# Patient Record
Sex: Female | Born: 2000
Health system: Southern US, Community
[De-identification: ages and names within clinical notes are randomized; demographics above are authoritative.]

## PROBLEM LIST (undated history)

## (undated) DIAGNOSIS — A749 Chlamydial infection, unspecified: Secondary | ICD-10-CM

## (undated) DIAGNOSIS — T7840XA Allergy, unspecified, initial encounter: Secondary | ICD-10-CM

## (undated) HISTORY — DX: Allergy, unspecified, initial encounter: T78.40XA

---
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
## 2019-06-18 ENCOUNTER — Ambulatory Visit: Payer: Managed Care, Other (non HMO)

## 2019-07-23 ENCOUNTER — Ambulatory Visit: Payer: Managed Care, Other (non HMO) | Attending: Internal Medicine

## 2019-07-23 DIAGNOSIS — Z23 Encounter for immunization: Secondary | ICD-10-CM

## 2019-07-23 NOTE — Progress Notes (Signed)
   Covid-19 Vaccination Clinic  Name:  Rachel West    MRN: [id number] DOB: 04/03/2000  07/23/2019  Ms. Rachel West was observed post Covid-19 immunization for 15 minutes without incident. She was provided with Vaccine Information Sheet and instruction to access the V-Safe system.   Ms. Rachel West was instructed to call 911 with any severe reactions post vaccine: Marland Kitchen Difficulty breathing  . Swelling of face and throat  . A fast heartbeat  . A bad rash all over body  . Dizziness and weakness   Immunizations Administered    Name Date Dose VIS Date Route   Pfizer COVID-19 Vaccine 07/23/2019 11:01 AM 0.3 mL 05/04/2018 Intramuscular   Manufacturer: [employer], [employer]   Lot: [id number]   NDC: [address]-1

## 2019-08-15 ENCOUNTER — Ambulatory Visit: Payer: Managed Care, Other (non HMO) | Attending: Internal Medicine

## 2019-08-15 DIAGNOSIS — Z23 Encounter for immunization: Secondary | ICD-10-CM

## 2019-08-15 NOTE — Progress Notes (Signed)
   Covid-19 Vaccination Clinic  Name:  Rachel West    MRN: [id number] DOB: 22-Mar-2000  08/15/2019  [name] was observed post Covid-19 immunization for 15 minutes without incident. She was provided with Vaccine Information Sheet and instruction to access the V-Safe system.   [name] was instructed to call 911 with any severe reactions post vaccine: Marland Kitchen Difficulty breathing  . Swelling of face and throat  . A fast heartbeat  . A bad rash all over body  . Dizziness and weakness   Immunizations Administered    Name Date Dose VIS Date Route   Pfizer COVID-19 Vaccine 08/15/2019  9:53 AM 0.3 mL 05/04/2018 Intramuscular   Manufacturer: [employer], [employer]   Lot: [id number]   NDC: [address]-1

## 2019-11-30 ENCOUNTER — Ambulatory Visit: Payer: Managed Care, Other (non HMO) | Attending: Adult Medicine | Admitting: Physical Therapy

## 2019-11-30 ENCOUNTER — Other Ambulatory Visit: Payer: Self-pay

## 2019-11-30 ENCOUNTER — Encounter: Payer: Self-pay | Admitting: Physical Therapy

## 2019-11-30 DIAGNOSIS — G8929 Other chronic pain: Secondary | ICD-10-CM | POA: Insufficient documentation

## 2019-11-30 DIAGNOSIS — M545 Low back pain, unspecified: Secondary | ICD-10-CM

## 2019-11-30 DIAGNOSIS — M62838 Other muscle spasm: Secondary | ICD-10-CM | POA: Insufficient documentation
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
## 2019-11-30 NOTE — Patient Instructions (Signed)
Access Code: [email or web address]: 09/22/2021Prepared by: Victorino Dike PaaExercises  Supine Lower Trunk Rotation - 2 x daily - 7 x weekly - 2 sets - 10 reps - 10 hold  Supine Transversus Abdominis Bracing - Hands on Stomach - 2 x daily - 7 x weekly - 2 sets - 10 reps - 5 hold  Cat-Camel - 2 x daily - 7 x weekly - 1 sets - 10 reps - 10 hold  Cat-Camel to Child's Pose - 2 x daily - 7 x weekly - 1 sets - 5 reps - 30 hold  Child's Pose with Thread the Needle - 2 x daily - 7 x weekly - 1 sets - 5 reps - 30 hold

## 2019-12-07 ENCOUNTER — Ambulatory Visit: Payer: Managed Care, Other (non HMO) | Admitting: Physical Therapy

## 2019-12-07 ENCOUNTER — Other Ambulatory Visit: Payer: Self-pay

## 2019-12-07 ENCOUNTER — Encounter: Payer: Self-pay | Admitting: Physical Therapy

## 2019-12-07 DIAGNOSIS — G8929 Other chronic pain: Secondary | ICD-10-CM

## 2019-12-07 DIAGNOSIS — M545 Low back pain: Secondary | ICD-10-CM | POA: Diagnosis not present

## 2019-12-07 DIAGNOSIS — M62838 Other muscle spasm: Secondary | ICD-10-CM

## 2019-12-07 NOTE — Therapy (Signed)
[facility] Outpatient Rehabilitation [facility] [address] [place], [place], [address] Phone: [phone]   Fax:  [phone]  Physical Therapy Treatment  Patient Details  Name: Rachel West MRN: [id number] Date of Birth: 11-Apr-2000 Referring Provider (PT): Dr. Francisca December   Encounter Date: 12/07/2019   PT End of Session - 12/07/19 1024    Visit Number 2    Number of Visits 12    Date for PT Re-Evaluation 01/11/20    Authorization Type [employer]    PT Start Time 1017    PT Stop Time 1055    PT Time Calculation (min) 38 min           Past Medical History:  Diagnosis Date  . Allergy     History reviewed. No pertinent surgical history.  There were no vitals filed for this visit.   Subjective Assessment - 12/07/19 1019    Subjective No pain right now. Increased with sitting for 20 minutes especially with slouching.    Currently in Pain? No/denies                             [facility] Adult PT Treatment/Exercise - 12/07/19 0001      Exercises   Exercises Lumbar      Lumbar Exercises: Stretches   Press Ups 10 reps;10 seconds    Press Ups Limitations for abstretch and lumbar extension ROM     Quadruped Mid Back Stretch 1 rep;5 reps    Quadruped Mid Back Stretch Limitations 60 sec , then laterals     Other Lumbar Stretch Exercise open book with top LE crossed over bottom     Other Lumbar Stretch Exercise Qped slow rocking into childs pose       Lumbar Exercises: Aerobic   Nustep L5 UE/LE x 5 minues    reported right lumbar pain     Lumbar Exercises: Supine   Bent Knee Raise Limitations Table top holds, 10 sec  at 90/90 with unilateral lift/lower     Dead Bug 10 reps    Dead Bug Limitations from 90/90    Bridge 20 reps      Lumbar Exercises: Quadruped   Madcat/Old Horse 10 reps    Opposite Arm/Leg Raise 10 reps                  PT Education - 12/07/19 1030    Education Details FOTO report and predictions reviewed with  patient    Person(s) Educated Patient    Methods Explanation;Handout    Comprehension Verbalized understanding               PT Long Term Goals - 11/30/19 1237      PT LONG TERM GOAL #1   Title Pt will be able to demo normal spinal ROM, flexion and extension without pain    Time 6    Period Weeks    Status New    Target Date 01/11/20      PT LONG TERM GOAL #2   Title Pt will be able to sit more comfortably in class with min pain increase after 45 min    Time 6    Period Weeks    Status New    Target Date 01/11/20      PT LONG TERM GOAL #3   Title Pt will be able to report no pain with transfers in and out the car    Time 6  Period Weeks    Status New    Target Date 01/11/20      PT LONG TERM GOAL #4   Title Pt will be able to demo safe core routine without increasing back pain    Time 6    Period Weeks    Status New    Target Date 01/11/20      PT LONG TERM GOAL #5   Title Pt will be able to improve FOTO to 31% limited or less    Baseline 48%    Time 6    Period Weeks    Status New    Target Date 01/11/20                 Plan - 12/07/19 1032    Clinical Impression Statement pt reports min relief with initial stretches. Reviewed HEP and progressed with core/ Lumbar stabilization. Updated HEP.    PT Next Visit Plan spinal mobility (q-ped) and stabilzation. FOTO- 6th visits status    PT Home Exercise Plan Access Code: [email or web address]: LTR, pelvic tilt, q-ped cat cow to childs pose to thread the needle           Patient will benefit from skilled therapeutic intervention in order to improve the following deficits and impairments:  Decreased mobility, Increased fascial restricitons, Impaired flexibility, Decreased range of motion, Pain, Difficulty walking, Decreased strength  Visit Diagnosis: Chronic bilateral low back pain without sciatica  Other muscle spasm     Problem List Patient Active Problem List   Diagnosis Date Noted  . Allergic  rhinitis 08/20/2014  . RAD (reactive airway disease) 03/22/2012    Sherrie Mustache, PTA 12/07/2019, 10:59 AM  [facility] [address] [place], [place], [address] Phone: [phone]   Fax:  [phone]  Name: Rachel West MRN: [id number] Date of Birth: [birth year]

## 2019-12-13 ENCOUNTER — Ambulatory Visit: Payer: Managed Care, Other (non HMO) | Attending: Adult Medicine | Admitting: Physical Therapy

## 2019-12-13 ENCOUNTER — Other Ambulatory Visit: Payer: Self-pay

## 2019-12-13 ENCOUNTER — Encounter: Payer: Self-pay | Admitting: Physical Therapy

## 2019-12-13 DIAGNOSIS — M545 Low back pain, unspecified: Secondary | ICD-10-CM | POA: Insufficient documentation

## 2019-12-13 DIAGNOSIS — M62838 Other muscle spasm: Secondary | ICD-10-CM | POA: Diagnosis present

## 2019-12-13 DIAGNOSIS — G8929 Other chronic pain: Secondary | ICD-10-CM | POA: Insufficient documentation

## 2019-12-13 NOTE — Therapy (Signed)
[facility] Outpatient Rehabilitation [facility] [address] [place], [place], [address] Phone: [phone]   Fax:  [phone]  Physical Therapy Treatment  Patient Details  Name: Rachel West MRN: [id number] Date of Birth: March 04, 2001 Referring Provider (PT): Dr. Francisca December   Encounter Date: 12/13/2019   PT End of Session - 12/13/19 1107    Visit Number 3    Number of Visits 12    Date for PT Re-Evaluation 01/11/20    Authorization Type [employer]    PT Start Time 1104    PT Stop Time 1145    PT Time Calculation (min) 41 min           Past Medical History:  Diagnosis Date  . Allergy     History reviewed. No pertinent surgical history.  There were no vitals filed for this visit.   Subjective Assessment - 12/13/19 1109    Subjective no pain on arrival. Pain increased with prolonged walking or sitting. Had pan with walking in the neighborhood.    Currently in Pain? No/denies                             [facility] Adult PT Treatment/Exercise - 12/13/19 0001      Lumbar Exercises: Stretches   Press Ups 10 reps;10 seconds    Press Ups Limitations for abstretch and lumbar extension ROM     Quadruped Mid Back Stretch 1 rep;5 reps    Quadruped Mid Back Stretch Limitations 60 sec , then laterals     Other Lumbar Stretch Exercise open book with top LE crossed over bottom     Other Lumbar Stretch Exercise Qped slow rocking into childs pose       Lumbar Exercises: Aerobic   Nustep L5 UE/LE x 5 minues    reported right lumbar pain     Lumbar Exercises: Supine   Bent Knee Raise Limitations Table top holds, 10 sec  at 90/90 with unilateral lift/lower     Dead Bug 10 reps    Dead Bug Limitations from 90/90    Bridge 20 reps    Other Supine Lumbar Exercises clam with green band x 20 cues for abdominal draw in       Lumbar Exercises: Quadruped   Madcat/Old Horse 10 reps    Opposite Arm/Leg Raise 10 reps      Cryotherapy   Number Minutes  Cryotherapy 10 Minutes    Cryotherapy Location Lumbar Spine    Type of Cryotherapy Ice pack      Manual Therapy   Manual Therapy Soft tissue mobilization    Soft tissue mobilization STW right lumbar paraspinal and QL                        PT Long Term Goals - 11/30/19 1237      PT LONG TERM GOAL #1   Title Pt will be able to demo normal spinal ROM, flexion and extension without pain    Time 6    Period Weeks    Status New    Target Date 01/11/20      PT LONG TERM GOAL #2   Title Pt will be able to sit more comfortably in class with min pain increase after 45 min    Time 6    Period Weeks    Status New    Target Date 01/11/20      PT LONG TERM  GOAL #3   Title Pt will be able to report no pain with transfers in and out the car    Time 6    Period Weeks    Status New    Target Date 01/11/20      PT LONG TERM GOAL #4   Title Pt will be able to demo safe core routine without increasing back pain    Time 6    Period Weeks    Status New    Target Date 01/11/20      PT LONG TERM GOAL #5   Title Pt will be able to improve FOTO to 31% limited or less    Baseline 48%    Time 6    Period Weeks    Status New    Target Date 01/11/20                 Plan - 12/13/19 1107    Clinical Impression Statement Pt reports she notes slow improvement. She has difficulty with sitting. She has slightly less pain with transfers and is still most painful on right lower lumbar. Reviewed and continued core progression. Performed STW to right lumbar and applied ice pack to decreased pain.    PT Next Visit Plan spinal mobility (q-ped) and stabilzation. FOTO- 6th visits status    PT Home Exercise Plan Access Code: [email or web address]: LTR, pelvic tilt, q-ped cat cow to childs pose to thread the needle           Patient will benefit from skilled therapeutic intervention in order to improve the following deficits and impairments:  Decreased mobility, Increased fascial restricitons,  Impaired flexibility, Decreased range of motion, Pain, Difficulty walking, Decreased strength  Visit Diagnosis: Chronic bilateral low back pain without sciatica  Other muscle spasm     Problem List Patient Active Problem List   Diagnosis Date Noted  . Allergic rhinitis 08/20/2014  . RAD (reactive airway disease) 03/22/2012    Sherrie Mustache, PTA 12/13/2019, 12:55 PM  [facility] [address] [place], [place], [address] Phone: [phone]   Fax:  [phone]  Name: Rachel West MRN: [id number] Date of Birth: Jan 19, 2001

## 2019-12-21 ENCOUNTER — Encounter: Payer: Self-pay | Admitting: Physical Therapy

## 2019-12-21 ENCOUNTER — Other Ambulatory Visit: Payer: Self-pay

## 2019-12-21 ENCOUNTER — Ambulatory Visit: Payer: Managed Care, Other (non HMO) | Admitting: Physical Therapy

## 2019-12-21 DIAGNOSIS — M62838 Other muscle spasm: Secondary | ICD-10-CM

## 2019-12-21 DIAGNOSIS — M545 Low back pain, unspecified: Secondary | ICD-10-CM | POA: Diagnosis not present

## 2019-12-21 DIAGNOSIS — G8929 Other chronic pain: Secondary | ICD-10-CM

## 2019-12-21 NOTE — Therapy (Signed)
[facility] Outpatient Rehabilitation [facility] [address] [place], [place], [address] Phone: [phone]   Fax:  [phone]  Physical Therapy Treatment  Patient Details  Name: Rachel West MRN: [id number] Date of Birth: 07-Jul-2000 Referring Provider (PT): Dr. Francisca December   Encounter Date: 12/21/2019   PT End of Session - 12/21/19 1104    Visit Number 4    Number of Visits 12    Date for PT Re-Evaluation 01/11/20    Authorization Type [employer]    PT Start Time 1100    PT Stop Time 1153    PT Time Calculation (min) 53 min           Past Medical History:  Diagnosis Date  . Allergy     History reviewed. No pertinent surgical history.  There were no vitals filed for this visit.   Subjective Assessment - 12/21/19 1104    Subjective No pain on arrival. Walking longer without pain.    Currently in Pain? No/denies                             [facility] Adult PT Treatment/Exercise - 12/21/19 0001      Lumbar Exercises: Stretches   Press Ups 10 reps;10 seconds    Press Ups Limitations for abstretch and lumbar extension ROM     Quadruped Mid Back Stretch 1 rep;5 reps    Quadruped Mid Back Stretch Limitations 60 sec , then laterals     Other Lumbar Stretch Exercise open book with top LE crossed over bottom       Lumbar Exercises: Aerobic   Recumbent Bike L2 x 5 minutes       Lumbar Exercises: Standing   Row 20 reps    Theraband Level (Row) Level 3 (Green)    Shoulder Extension 20 reps    Theraband Level (Shoulder Extension) Level 3 (Green)    Other Standing Lumbar Exercises functional squats using free motion for UE x 20      Lumbar Exercises: Supine   Bent Knee Raise Limitations Table top holds, 10 sec  at 90/90 with unilateral lift/lower     Dead Bug 10 reps    Dead Bug Limitations from 90/90    Other Supine Lumbar Exercises clam with green band x 20 cues for abdominal draw in       Lumbar Exercises: Sidelying   Other Sidelying  Lumbar Exercises plank on elbow and knees- 10 hip taps       Lumbar Exercises: Prone   Opposite Arm/Leg Raise 10 reps    Opposite Arm/Leg Raise Limitations each , cues for core bracing    Other Prone Lumbar Exercises POE plank x 20 sec       Lumbar Exercises: Quadruped   Madcat/Old Horse 10 reps    Opposite Arm/Leg Raise 10 reps    Opposite Arm/Leg Raise Limitations tapping elbow to knee x 10 each side       Cryotherapy   Cryotherapy Location Lumbar Spine      Manual Therapy   Soft tissue mobilization STW right lumbar paraspinal and QL                        PT Long Term Goals - 11/30/19 1237      PT LONG TERM GOAL #1   Title Pt will be able to demo normal spinal ROM, flexion and extension without pain    Time  6    Period Weeks    Status New    Target Date 01/11/20      PT LONG TERM GOAL #2   Title Pt will be able to sit more comfortably in class with min pain increase after 45 min    Time 6    Period Weeks    Status New    Target Date 01/11/20      PT LONG TERM GOAL #3   Title Pt will be able to report no pain with transfers in and out the car    Time 6    Period Weeks    Status New    Target Date 01/11/20      PT LONG TERM GOAL #4   Title Pt will be able to demo safe core routine without increasing back pain    Time 6    Period Weeks    Status New    Target Date 01/11/20      PT LONG TERM GOAL #5   Title Pt will be able to improve FOTO to 31% limited or less    Baseline 48%    Time 6    Period Weeks    Status New    Target Date 01/11/20                 Plan - 12/21/19 1105    Clinical Impression Statement Pt reports continued improvement in pain and improved activity tolerance. She is able to walk outdoors up to 2 hours with her friends in her neighborhood without increased pain. She has pain with sitting 20 minutes or more but if she stretches in her chair she does not have increased pain. She reports transfers are better if she has  been moving otherwise transfers can still be painful after prolonged positioning. Continued with lumbar stab in various positions. No pain with any therex today. Continued STW to right lower lumbar.    PT Next Visit Plan spinal mobility (q-ped) and stabilzation. FOTO- 6th visits status    PT Home Exercise Plan Access Code: [email or web address]: LTR, pelvic tilt, q-ped cat cow to childs pose to thread the needle           Patient will benefit from skilled therapeutic intervention in order to improve the following deficits and impairments:  Decreased mobility, Increased fascial restricitons, Impaired flexibility, Decreased range of motion, Pain, Difficulty walking, Decreased strength  Visit Diagnosis: Chronic bilateral low back pain without sciatica  Other muscle spasm     Problem List Patient Active Problem List   Diagnosis Date Noted  . Allergic rhinitis 08/20/2014  . RAD (reactive airway disease) 03/22/2012    Sherrie Mustache, PTA 12/21/2019, 11:46 AM  [facility] [address] [place], [place], [address] Phone: [phone]   Fax:  [phone]  Name: Teandra Harlan MRN: [id number] Date of Birth: 02/18/2001

## 2019-12-23 ENCOUNTER — Other Ambulatory Visit: Payer: Self-pay

## 2019-12-23 ENCOUNTER — Encounter: Payer: Self-pay | Admitting: Physical Therapy

## 2019-12-23 ENCOUNTER — Ambulatory Visit: Payer: Managed Care, Other (non HMO) | Admitting: Physical Therapy

## 2019-12-23 DIAGNOSIS — M62838 Other muscle spasm: Secondary | ICD-10-CM

## 2019-12-23 DIAGNOSIS — M545 Low back pain, unspecified: Secondary | ICD-10-CM | POA: Diagnosis not present

## 2019-12-23 DIAGNOSIS — G8929 Other chronic pain: Secondary | ICD-10-CM

## 2019-12-23 NOTE — Therapy (Addendum)
[place], [place], [address] Phone: [phone]   Fax:  [phone]  Physical Therapy Treatment/Discharge  Patient Details  Name: Rachel West MRN: [id number] Date of Birth: 07/19/2000 Referring Provider (PT): Dr. Vira Browns   Encounter Date: 12/23/2019   PT End of Session - 12/23/19 1158    Visit Number 5    Number of Visits 12    Date for PT Re-Evaluation 01/11/20    Authorization Type [employer]    Authorization Time Period Needs 6th visit FOTO status    PT Start Time 1102    PT Stop Time 1200    PT Time Calculation (min) 58 min           Past Medical History:  Diagnosis Date  . Allergy     History reviewed. No pertinent surgical history.  There were no vitals filed for this visit.   Subjective Assessment - 12/23/19 1106    Subjective No pain right now. Felt really stretched out after last session.    Currently in Pain? No/denies                      [facility] Adult PT Treatment/Exercise - 12/23/19 0001      Lumbar Exercises: Stretches   Press Ups 10 reps;10 seconds    Quadruped Mid Back Stretch 1 rep;5 reps    Quadruped Mid Back Stretch Limitations 60 sec , then laterals       Lumbar Exercises: Aerobic   Recumbent Bike L2 x 5 minutes       Lumbar Exercises: Standing   Row 20 reps    Theraband Level (Row) Level 3 (Green)    Shoulder Extension 20 reps    Theraband Level (Shoulder Extension) Level 3 (Green)    Other Standing Lumbar Exercises Palloff press double green band x 10 each way     Other Standing Lumbar Exercises functional squats at sink       Lumbar Exercises: Supine   AB Set Limitations Pilates ab prep x 10    head lift/ UE reach   Bent Knee Raise Limitations at 90/90 with unilateral lift/lower     Dead Bug 10 reps    Dead Bug Limitations from 90/90    Bridge 20 reps    Bridge with clamshell 10 reps      Lumbar Exercises: Sidelying   Other Sidelying  Lumbar Exercises plank on elbow and knees 10 sec x 2 each       Lumbar Exercises: Prone   Opposite Arm/Leg Raise 10 reps    Opposite Arm/Leg Raise Limitations each , cues for core bracing    Other Prone Lumbar Exercises POE plank x 25 sec x 2      Lumbar Exercises: Quadruped   Madcat/Old Horse 10 reps    Opposite Arm/Leg Raise 20 reps    Opposite Arm/Leg Raise Limitations tapping elbow to knee x 10 each side       Modalities   Modalities Moist Heat      Moist Heat Therapy   Number Minutes Moist Heat 10 Minutes    Moist Heat Location Lumbar Spine                       PT Long Term Goals - 11/30/19 1237      PT LONG TERM GOAL #1   Title Pt will be able to demo normal spinal ROM, flexion and extension without pain  Time 6    Period Weeks    Status New    Target Date 01/11/20      PT LONG TERM GOAL #2   Title Pt will be able to sit more comfortably in class with min pain increase after 45 min    Time 6    Period Weeks    Status New    Target Date 01/11/20      PT LONG TERM GOAL #3   Title Pt will be able to report no pain with transfers in and out the car    Time 6    Period Weeks    Status New    Target Date 01/11/20      PT LONG TERM GOAL #4   Title Pt will be able to demo safe core routine without increasing back pain    Time 6    Period Weeks    Status New    Target Date 01/11/20      PT LONG TERM GOAL #5   Title Pt will be able to improve FOTO to 31% limited or less    Baseline 48%    Time 6    Period Weeks    Status New    Target Date 01/11/20                 Plan - 12/23/19 1155    Clinical Impression Statement Pt reports she felt stretched out after last session and feels STW is helpful. She continues with right low back pain and tightness in right paraspinal with prolonged positioning. She is interested in [facility].    PT Next Visit Plan TPDN next session if possible, spinal mobility (q-ped) and stabilzation. FOTO- 6th visits  status    PT Home Exercise Plan Access Code: [email or web address]: LTR, pelvic tilt, q-ped cat cow to childs pose to thread the needle           Patient will benefit from skilled therapeutic intervention in order to improve the following deficits and impairments:  Decreased mobility, Increased fascial restricitons, Impaired flexibility, Decreased range of motion, Pain, Difficulty walking, Decreased strength  Visit Diagnosis: Chronic bilateral low back pain without sciatica  Other muscle spasm     Problem List Patient Active Problem List   Diagnosis Date Noted  . Allergic rhinitis 08/20/2014  . RAD (reactive airway disease) 03/22/2012    Dorene Ar, PTA 12/23/2019, 12:02 PM  [facility] [address] [place], [place], [address] Phone: [phone]   Fax:  [phone]  Name: Rachel West MRN: [id number] Date of Birth: 12/08/2000  PHYSICAL THERAPY DISCHARGE SUMMARY  Visits from Start of Care: 5  Current functional level related to goals / functional outcomes: See above   Remaining deficits: Unknown   Education / Equipment: HEP, posture, body mechanics Plan: Patient agrees to discharge.  Patient goals were not met. Patient is being discharged due to financial reasons.  ?????     Raeford Razor, PT 02/24/20 11:45 AM Phone: [phone] Fax: [phone]

## 2019-12-26 ENCOUNTER — Ambulatory Visit: Payer: Managed Care, Other (non HMO) | Admitting: Physical Therapy

## 2019-12-28 ENCOUNTER — Ambulatory Visit: Payer: Managed Care, Other (non HMO) | Admitting: Physical Therapy

## 2020-04-12 ENCOUNTER — Other Ambulatory Visit: Payer: Self-pay

## 2020-04-12 ENCOUNTER — Ambulatory Visit (HOSPITAL_COMMUNITY)
Admission: RE | Admit: 2020-04-12 | Discharge: 2020-04-12 | Disposition: A | Discharge status: Home / Self Care | Payer: Managed Care, Other (non HMO) | Source: Ambulatory Visit | Attending: Emergency Medicine | Admitting: Emergency Medicine

## 2020-04-12 VITALS — BP 104/61 | HR 92 | Temp 98.0°F | Resp 19

## 2020-04-12 DIAGNOSIS — M545 Low back pain, unspecified: Secondary | ICD-10-CM

## 2020-04-12 DIAGNOSIS — Z791 Long term (current) use of non-steroidal anti-inflammatories (NSAID): Secondary | ICD-10-CM | POA: Diagnosis not present

## 2020-04-12 DIAGNOSIS — G8929 Other chronic pain: Secondary | ICD-10-CM

## 2020-04-12 DIAGNOSIS — Z113 Encounter for screening for infections with a predominantly sexual mode of transmission: Secondary | ICD-10-CM | POA: Diagnosis not present

## 2020-04-12 DIAGNOSIS — Z793 Long term (current) use of hormonal contraceptives: Secondary | ICD-10-CM | POA: Diagnosis not present

## 2020-04-12 LAB — HIV ANTIBODY (ROUTINE TESTING W REFLEX): HIV Screen 4th Generation wRfx: NONREACTIVE

## 2020-04-12 MED ORDER — PREDNISONE 10 MG PO TABS: 40.0000 mg | ORAL_TABLET | Freq: Every day | ORAL | 0 refills | Status: DC
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
## 2020-04-12 NOTE — Discharge Instructions (Addendum)
Follow up with PCP if symptoms worsen for reevaluation   Take 4 pills by mouth every morning for 5 days. Do not take pills at night   Results for lab will come back in 2-3 days. Will call if positive

## 2020-04-13 ENCOUNTER — Telehealth (HOSPITAL_COMMUNITY): Payer: Self-pay | Admitting: Emergency Medicine

## 2020-04-13 LAB — CERVICOVAGINAL ANCILLARY ONLY
Bacterial Vaginitis (gardnerella): NEGATIVE
Candida Glabrata: NEGATIVE
Candida Vaginitis: POSITIVE — AB
Chlamydia: NEGATIVE
Comment: NEGATIVE
Comment: NEGATIVE
Comment: NEGATIVE
Comment: NEGATIVE
Comment: NEGATIVE
Comment: NORMAL
Neisseria Gonorrhea: NEGATIVE
Trichomonas: NEGATIVE

## 2020-04-13 LAB — RPR: RPR Ser Ql: NONREACTIVE

## 2020-04-13 MED ORDER — FLUCONAZOLE 150 MG PO TABS: 150.0000 mg | ORAL_TABLET | Freq: Once | ORAL | 0 refills | Status: AC

## 2020-07-14 ENCOUNTER — Other Ambulatory Visit: Payer: Self-pay

## 2020-07-14 ENCOUNTER — Ambulatory Visit
Admission: RE | Admit: 2020-07-14 | Discharge: 2020-07-14 | Disposition: A | Discharge status: Home / Self Care | Payer: Managed Care, Other (non HMO) | Source: Ambulatory Visit | Attending: Family Medicine | Admitting: Family Medicine

## 2020-07-14 VITALS — BP 116/79 | HR 93 | Temp 98.2°F | Resp 20

## 2020-07-14 DIAGNOSIS — N898 Other specified noninflammatory disorders of vagina: Secondary | ICD-10-CM | POA: Diagnosis present

## 2020-07-14 HISTORY — DX: Chlamydial infection, unspecified: A74.9

## 2020-07-14 MED ORDER — METRONIDAZOLE 500 MG PO TABS: 500.0000 mg | ORAL_TABLET | Freq: Two times a day (BID) | ORAL | 0 refills | Status: DC

## 2020-07-14 NOTE — ED Provider Notes (Signed)
[facility]   [id number] 07/14/20 Arrival Time: 0908  ASSESSMENT & PLAN:  1. Vaginal discharge    No indication for gonorrhea/chlamydia empiric tx at this time. Tx empirically for BV.  Begin: Meds ordered this encounter  Medications  . metroNIDAZOLE (FLAGYL) 500 MG tablet    Sig: Take 1 tablet (500 mg total) by mouth 2 (two) times daily.    Dispense:  14 tablet    Refill:  0     Discharge Instructions     We have sent testing for sexually transmitted infections. We will notify you of any positive results once they are received. If required, we will prescribe any medications you might need.  Please refrain from all sexual activity for at least the next seven days.     Without s/s of PID.  Labs Reviewed  CERVICOVAGINAL ANCILLARY ONLY   Will notify of any positive results. Instructed to refrain from sexual activity for at least seven days.  Reviewed expectations re: course of current medical issues. Questions answered. Outlined signs and symptoms indicating need for more acute intervention. Patient verbalized understanding. After Visit Summary given.   SUBJECTIVE:  Rachel West is a 20 y.o. female who presents with complaint of vaginal discharge. H/O BV 'and it feels like it'. Onset gradual. First noticed 2-3 d ago. Describes discharge as thin and clear; without significant odor. No specific aggravating or alleviating factors reported. Denies: urinary frequency, dysuria and gross hematuria. Afebrile. No abdominal or pelvic pain. Normal PO intake wihout n/v. No genital rashes or lesions. Reports that she is sexually active but without much concern re: STD. OTC treatment: none.  Patient's last menstrual period was 06/18/2020 (exact date).   OBJECTIVE:  Vitals:   07/14/20 0921  BP: 116/79  Pulse: 93  Resp: 20  Temp: 98.2 F (36.8 C)  TempSrc: Oral  SpO2: 99%     General appearance: alert, cooperative, appears stated age and no distress Lungs:  unlabored respirations; speaks full sentences without difficulty Back: no CVA tenderness; FROM at waist Abdomen: soft, non-tender GU: deferred Skin: warm and dry Psychological: alert and cooperative; normal mood and affect.   Labs Reviewed  CERVICOVAGINAL ANCILLARY ONLY    No Known Allergies  Past Medical History:  Diagnosis Date  . Allergy   . Chlamydia    Family History  Problem Relation Age of Onset  . Diabetes Mother   . Hyperlipidemia Father    Social History   Socioeconomic History  . Marital status: Single    Spouse name: Not on file  . Number of children: Not on file  . Years of education: Not on file  . Highest education level: Not on file  Occupational History  . Not on file  Tobacco Use  . Smoking status: Never Smoker  . Smokeless tobacco: Never Used  Vaping Use  . Vaping Use: Never used  Substance and Sexual Activity  . Alcohol use: Not Currently  . Drug use: Never  . Sexual activity: Yes    Birth control/protection: Pill  Other Topics Concern  . Not on file  Social History Narrative  . Not on file   Social Determinants of Health   Financial Resource Strain: Not on file  Food Insecurity: Not on file  Transportation Needs: Not on file  Physical Activity: Not on file  Stress: Not on file  Social Connections: Not on file  Intimate Partner Violence: Not on file          Mardella Layman, MD 07/14/20  1110  

## 2020-07-14 NOTE — ED Triage Notes (Signed)
Pt c/o vaginal odor x 2-3 days; states vaginal discharge is normal for her.  Denies abd pain.

## 2020-07-14 NOTE — Discharge Instructions (Signed)
We have sent testing for sexually transmitted infections. We will notify you of any positive results once they are received. If required, we will prescribe any medications you might need.  Please refrain from all sexual activity for at least the next seven days.  

## 2020-07-16 LAB — CERVICOVAGINAL ANCILLARY ONLY
Bacterial Vaginitis (gardnerella): POSITIVE — AB
Candida Glabrata: NEGATIVE
Candida Vaginitis: NEGATIVE
Chlamydia: NEGATIVE
Comment: NEGATIVE
Comment: NEGATIVE
Comment: NEGATIVE
Comment: NEGATIVE
Comment: NEGATIVE
Comment: NORMAL
Neisseria Gonorrhea: NEGATIVE
Trichomonas: NEGATIVE

## 2020-07-29 ENCOUNTER — Ambulatory Visit
Admission: RE | Admit: 2020-07-29 | Discharge: 2020-07-29 | Disposition: A | Discharge status: Home / Self Care | Payer: Managed Care, Other (non HMO) | Source: Ambulatory Visit | Attending: Nurse Practitioner | Admitting: Nurse Practitioner

## 2020-07-29 ENCOUNTER — Other Ambulatory Visit: Payer: Self-pay

## 2020-07-29 VITALS — BP 108/71 | HR 93 | Temp 98.2°F

## 2020-07-29 DIAGNOSIS — N898 Other specified noninflammatory disorders of vagina: Secondary | ICD-10-CM | POA: Diagnosis not present

## 2020-07-29 DIAGNOSIS — R8281 Pyuria: Secondary | ICD-10-CM | POA: Insufficient documentation

## 2020-07-29 DIAGNOSIS — Z113 Encounter for screening for infections with a predominantly sexual mode of transmission: Secondary | ICD-10-CM | POA: Insufficient documentation

## 2020-07-29 LAB — POCT URINALYSIS DIP (MANUAL ENTRY)
Bilirubin, UA: NEGATIVE
Glucose, UA: NEGATIVE mg/dL
Ketones, POC UA: NEGATIVE mg/dL
Nitrite, UA: NEGATIVE
Protein Ur, POC: NEGATIVE mg/dL
Spec Grav, UA: >=1.03 — AB (ref 1.010–1.025)
Urobilinogen, UA: 0.2 E.U./dL
pH, UA: 7 (ref 5.0–8.0)

## 2020-07-29 LAB — POCT URINE PREGNANCY: Preg Test, Ur: NEGATIVE

## 2020-07-29 MED ORDER — METRONIDAZOLE 500 MG PO TABS: 500.0000 mg | ORAL_TABLET | Freq: Two times a day (BID) | ORAL | 0 refills | Status: DC

## 2020-07-29 MED ORDER — NITROFURANTOIN MONOHYD MACRO 100 MG PO CAPS: 100.0000 mg | ORAL_CAPSULE | Freq: Two times a day (BID) | ORAL | 0 refills | Status: DC

## 2020-07-29 MED ORDER — FLUCONAZOLE 150 MG PO TABS: 150.0000 mg | ORAL_TABLET | ORAL | 0 refills | Status: AC

## 2020-07-29 NOTE — ED Provider Notes (Signed)
[facility]    CSN: [id number] Arrival date & time: 07/29/20  0904      History   Chief Complaint Chief Complaint  Patient presents with  . Appointment    Vaginal Discharge    HPI Rachel West is a 20 y.o. female.   Subjective:   Rachel West is a 20 y.o. female who presents for evaluation of vaginal symptoms of itching, irritation and vaginal discharge.  The discharge is described as greenish/yellowish and somewhat thick in consistency.  Symptoms have been present for the past 4-5 days. She denies any burning, lesions, odor, pain, dysuria, urinary frequency, nausea, vomiting, back pain, flank pain or fevers.  Menstrual pattern: bleeding regularly, LMP 07/20/20. Contraception: birth control pills. STI Risk: Possible STD exposure as patient is sexually active and does not use barrier methods.  The following portions of the patient's history were reviewed and updated as appropriate: allergies, current medications, past family history, past medical history, past social history, past surgical history and problem list.        Past Medical History:  Diagnosis Date  . Allergy   . Chlamydia     Patient Active Problem List   Diagnosis Date Noted  . Allergic rhinitis 08/20/2014  . RAD (reactive airway disease) 03/22/2012    History reviewed. No pertinent surgical history.  OB History   No obstetric history on file.      Home Medications    Prior to Admission medications   Medication Sig Start Date End Date Taking? Authorizing Provider  fluconazole (DIFLUCAN) 150 MG tablet Take 1 tablet (150 mg total) by mouth every 3 (three) days for 2 doses. 07/29/20 08/02/20 Yes Lurline Idol, FNP  metroNIDAZOLE (FLAGYL) 500 MG tablet Take 1 tablet (500 mg total) by mouth 2 (two) times daily. 07/29/20  Yes Lurline Idol, FNP  nitrofurantoin, macrocrystal-monohydrate, (MACROBID) 100 MG capsule Take 1 capsule (100 mg total) by mouth 2 (two) times daily. 07/29/20  Yes  Lurline Idol, FNP  norgestimate-ethinyl estradiol (ORTHO-CYCLEN) 0.25-35 MG-MCG tablet Take 1 tablet by mouth daily.   Yes [provider]  albuterol (PROVENTIL HFA;VENTOLIN HFA) 108 (90 BASE) MCG/ACT inhaler Inhale 2 puffs into the lungs every 6 (six) hours as needed for wheezing. Patient not taking: Reported on 05/02/2018 08/20/14 11/29/18  Elvina Sidle, MD  fluticasone [facility]) 50 MCG/ACT nasal spray Place 2 sprays into both nostrils daily. Patient not taking: Reported on 05/02/2018 08/20/14 11/29/18  Elvina Sidle, MD    Family History Family History  Problem Relation Age of Onset  . Diabetes Mother   . Hyperlipidemia Father     Social History Social History   Tobacco Use  . Smoking status: Never Smoker  . Smokeless tobacco: Never Used  Vaping Use  . Vaping Use: Never used  Substance Use Topics  . Alcohol use: Not Currently  . Drug use: Never     Allergies   Patient has no known allergies.   Review of Systems Review of Systems  Constitutional: Negative for fever.  Gastrointestinal: Negative for nausea and vomiting.  Genitourinary: Positive for vaginal discharge. Negative for dysuria, flank pain, frequency and genital sores.  All other systems reviewed and are negative.    Physical Exam Triage Vital Signs ED Triage Vitals [07/29/20 0918]  Enc Vitals Group     BP 108/71     Pulse Rate 93     Resp      Temp 98.2 F (36.8 C)     Temp Source Oral  SpO2 97 %     Weight      Height      Head Circumference      Peak Flow      Pain Score      Pain Loc      Pain Edu?      Excl. in GC?    No data found.  Updated Vital Signs BP 108/71 (BP Location: Left Arm)   Pulse 93   Temp 98.2 F (36.8 C) (Oral)   LMP 07/20/2020   SpO2 97%   Visual Acuity Right Eye Distance:   Left Eye Distance:   Bilateral Distance:    Right Eye Near:   Left Eye Near:    Bilateral Near:     Physical Exam Vitals reviewed.  Constitutional:       Appearance: Normal appearance.  HENT:     Head: Normocephalic.  Cardiovascular:     Rate and Rhythm: Normal rate.  Pulmonary:     Effort: Pulmonary effort is normal.  Abdominal:     General: Bowel sounds are normal.     Palpations: Abdomen is soft.     Tenderness: There is no abdominal tenderness. There is no right CVA tenderness or left CVA tenderness.  Musculoskeletal:        General: Normal range of motion.     Cervical back: Normal range of motion and neck supple.  Skin:    General: Skin is warm and dry.  Neurological:     General: No focal deficit present.     Mental Status: She is alert and oriented to person, place, and time.  Psychiatric:        Mood and Affect: Mood normal.        Behavior: Behavior normal.      UC Treatments / Results  Labs (all labs ordered are listed, but only abnormal results are displayed) Labs Reviewed  POCT URINALYSIS DIP (MANUAL ENTRY) - Abnormal; Notable for the following components:      Result Value   Spec Grav, UA >=1.030 (*)    Blood, UA trace-intact (*)    Leukocytes, UA Large (3+) (*)    All other components within normal limits  URINE CULTURE  POCT URINE PREGNANCY  CERVICOVAGINAL ANCILLARY ONLY    EKG   Radiology No results found.  Procedures Procedures (including critical care time)  Medications Ordered in UC Medications - No data to display  Initial Impression / Assessment and Plan / UC Course  I have reviewed the triage vital signs and the nursing notes.  Pertinent labs & imaging results that were available during my care of the patient were reviewed by me and considered in my medical decision making (see chart for details).    20 year old female presenting with vaginal itching, irritation and vaginal discharge.  Urinalysis shows large leukocytes even though she denies any dysuria.  She is sexually active and does not use barrier protection.  Plan: Prescription for Flagyl, Diflucan and Macrobid provided. Urine  cultures and vaginal cytology pending Symptomatic local care discussed. Abstinence from intercourse discussed. Discussed safe sex.  Today's evaluation has revealed no signs of a dangerous process. Discussed diagnosis with patient and/or guardian. Patient and/or guardian aware of their diagnosis, possible red flag symptoms to watch out for and need for close follow up. Patient and/or guardian understands verbal and written discharge instructions. Patient and/or guardian comfortable with plan and disposition.  Patient and/or guardian has a clear mental status at this time, good insight into  illness (after discussion and teaching) and has clear judgment to make decisions regarding their care  This care was provided during an unprecedented National Emergency due to the Novel Coronavirus (COVID-19) pandemic. COVID-19 infections and transmission risks place heavy strains on healthcare resources.  As this pandemic evolves, our facility, providers, and staff strive to respond fluidly, to remain operational, and to provide care relative to available resources and information. Outcomes are unpredictable and treatments are without well-defined guidelines. Further, the impact of COVID-19 on all aspects of urgent care, including the impact to patients seeking care for reasons other than COVID-19, is unavoidable during this national emergency. At this time of the global pandemic, management of patients has significantly changed, even for non-COVID positive patients given high local and regional COVID volumes at this time requiring high healthcare system and resource utilization. The standard of care for management of both COVID suspected and non-COVID suspected patients continues to change rapidly at the local, regional, national, and global levels. This patient was worked up and treated to the best available but ever changing evidence and resources available at this current time.   Documentation was completed with the  aid of voice recognition software. Transcription may contain typographical errors.   Final Clinical Impressions(s) / UC Diagnoses   Final diagnoses:  Vaginal discharge  Pyuria     Discharge Instructions     . Take all medications as prescribed. Make sure you finish all the medications even if you get better. Marland Kitchen Keep your genital area clean and dry. Use mild, unscented products . Do not douche or use tampons until your symptoms resolve.  . Do not have sex until your symptoms have resolved and you receive results from your test.  . Drink plenty of fluids     ED Prescriptions    Medication Sig Dispense Auth. Provider   metroNIDAZOLE (FLAGYL) 500 MG tablet Take 1 tablet (500 mg total) by mouth 2 (two) times daily. 14 tablet Lurline Idol, FNP   fluconazole (DIFLUCAN) 150 MG tablet Take 1 tablet (150 mg total) by mouth every 3 (three) days for 2 doses. 2 tablet Lurline Idol, FNP   nitrofurantoin, macrocrystal-monohydrate, (MACROBID) 100 MG capsule Take 1 capsule (100 mg total) by mouth 2 (two) times daily. 10 capsule Lurline Idol, FNP     PDMP not reviewed this encounter.   Cathlean Sauer [place], [place] 07/29/20 [phone]

## 2020-07-29 NOTE — ED Triage Notes (Signed)
Patient c/o vaginal discharge that's yellow x 3 days.  No odor but itching.

## 2020-07-29 NOTE — ED Notes (Signed)
Pt provided PO fluids to obtain additional urine sample for urine culture.

## 2020-07-29 NOTE — Discharge Instructions (Addendum)
Take all medications as prescribed. Make sure you finish all the medications even if you get better. Keep your genital area clean and dry. Use mild, unscented products Do not douche or use tampons until your symptoms resolve.  Do not have sex until your symptoms have resolved and you receive results from your test.  Drink plenty of fluids

## 2020-07-30 LAB — CERVICOVAGINAL ANCILLARY ONLY
Bacterial Vaginitis (gardnerella): NEGATIVE
Candida Glabrata: NEGATIVE
Candida Vaginitis: NEGATIVE
Chlamydia: NEGATIVE
Comment: NEGATIVE
Comment: NEGATIVE
Comment: NEGATIVE
Comment: NEGATIVE
Comment: NEGATIVE
Comment: NORMAL
Neisseria Gonorrhea: NEGATIVE
Trichomonas: NEGATIVE

## 2020-07-30 LAB — URINE CULTURE: Culture: 20000 — AB

## 2020-12-20 ENCOUNTER — Other Ambulatory Visit: Payer: Self-pay

## 2020-12-20 ENCOUNTER — Ambulatory Visit
Admission: RE | Admit: 2020-12-20 | Discharge: 2020-12-20 | Disposition: A | Discharge status: Home / Self Care | Payer: BC Managed Care – PPO | Source: Ambulatory Visit | Attending: Emergency Medicine | Admitting: Emergency Medicine

## 2020-12-20 VITALS — BP 122/83 | HR 103 | Temp 97.9°F | Resp 18

## 2020-12-20 DIAGNOSIS — N898 Other specified noninflammatory disorders of vagina: Secondary | ICD-10-CM | POA: Diagnosis present

## 2020-12-20 DIAGNOSIS — Z113 Encounter for screening for infections with a predominantly sexual mode of transmission: Secondary | ICD-10-CM | POA: Diagnosis present

## 2020-12-20 NOTE — Discharge Instructions (Addendum)
Your vaginal tests are pending.  If your test results are positive, we will call you.  You may require treatment at that time.  Do not have sexual activity for at least 7 days.    Follow up with your primary care provider if your symptoms are not improving.

## 2020-12-20 NOTE — ED Provider Notes (Signed)
Renaldo Fiddler    CSN: [id number] Arrival date & time: 12/20/20  1835      History   Chief Complaint Chief Complaint  Patient presents with   [facility] TRANSMITTED DISEASE    Appointment 1845     HPI Kaianna Dolezal is a 20 y.o. female.  Patient presents with malodorous white vaginal discharge.  She denies fever, chills, abdominal pain, pelvic pain, dysuria, hematuria, rash, lesions, or other symptoms.  She reports no sexual activity for the past 2 months.  History of bacterial vaginitis and chlamydia.  The history is provided by the patient and medical records.   Past Medical History:  Diagnosis Date   Allergy    Chlamydia     Patient Active Problem List   Diagnosis Date Noted   Allergic rhinitis 08/20/2014   RAD (reactive airway disease) 03/22/2012    History reviewed. No pertinent surgical history.  OB History   No obstetric history on file.      Home Medications    Prior to Admission medications   Medication Sig Start Date End Date Taking? Authorizing Provider  metroNIDAZOLE (FLAGYL) 500 MG tablet Take 1 tablet (500 mg total) by mouth 2 (two) times daily. 07/29/20   Lurline Idol, FNP  nitrofurantoin, macrocrystal-monohydrate, (MACROBID) 100 MG capsule Take 1 capsule (100 mg total) by mouth 2 (two) times daily. 07/29/20   Lurline Idol, FNP  norgestimate-ethinyl estradiol (ORTHO-CYCLEN) 0.25-35 MG-MCG tablet Take 1 tablet by mouth daily.    [provider]  albuterol (PROVENTIL HFA;VENTOLIN HFA) 108 (90 BASE) MCG/ACT inhaler Inhale 2 puffs into the lungs every 6 (six) hours as needed for wheezing. Patient not taking: Reported on 05/02/2018 08/20/14 11/29/18  Elvina Sidle, MD  fluticasone [facility]) 50 MCG/ACT nasal spray Place 2 sprays into both nostrils daily. Patient not taking: Reported on 05/02/2018 08/20/14 11/29/18  Elvina Sidle, MD    Family History Family History  Problem Relation Age of Onset   Diabetes Mother     Hyperlipidemia Father     Social History Social History   Tobacco Use   Smoking status: Never   Smokeless tobacco: Never  Vaping Use   Vaping Use: Never used  Substance Use Topics   Alcohol use: Not Currently   Drug use: Never     Allergies   Patient has no known allergies.   Review of Systems Review of Systems  Constitutional:  Negative for chills and fever.  Respiratory:  Negative for cough and shortness of breath.   Cardiovascular:  Negative for chest pain and palpitations.  Gastrointestinal:  Negative for abdominal pain and vomiting.  Genitourinary:  Positive for vaginal discharge. Negative for dysuria, flank pain, hematuria and pelvic pain.  Skin:  Negative for color change and rash.  All other systems reviewed and are negative.   Physical Exam Triage Vital Signs ED Triage Vitals  Enc Vitals Group     BP      Pulse      Resp      Temp      Temp src      SpO2      Weight      Height      Head Circumference      Peak Flow      Pain Score      Pain Loc      Pain Edu?      Excl. in GC?    No data found.  Updated Vital Signs BP 122/83 (BP Location: Left Arm)  Pulse (S) (!) 103   Temp 97.9 F (36.6 C) (Oral)   Resp 18   LMP 12/18/2020   SpO2 97%   Visual Acuity Right Eye Distance:   Left Eye Distance:   Bilateral Distance:    Right Eye Near:   Left Eye Near:    Bilateral Near:     Physical Exam Vitals and nursing note reviewed.  Constitutional:      General: She is not in acute distress.    Appearance: She is well-developed. She is not ill-appearing.  HENT:     Head: Normocephalic and atraumatic.     Mouth/Throat:     Mouth: Mucous membranes are moist.  Eyes:     Conjunctiva/sclera: Conjunctivae normal.  Cardiovascular:     Rate and Rhythm: Normal rate and regular rhythm.     Heart sounds: Normal heart sounds.  Pulmonary:     Effort: Pulmonary effort is normal. No respiratory distress.     Breath sounds: Normal breath sounds.   Abdominal:     Palpations: Abdomen is soft.     Tenderness: There is no abdominal tenderness. There is no right CVA tenderness, left CVA tenderness, guarding or rebound.  Musculoskeletal:     Cervical back: Neck supple.  Skin:    General: Skin is warm and dry.  Neurological:     General: No focal deficit present.     Mental Status: She is alert and oriented to person, place, and time.     Gait: Gait normal.  Psychiatric:        Mood and Affect: Mood normal.        Behavior: Behavior normal.     UC Treatments / Results  Labs (all labs ordered are listed, but only abnormal results are displayed) Labs Reviewed  CERVICOVAGINAL ANCILLARY ONLY    EKG   Radiology No results found.  Procedures Procedures (including critical care time)  Medications Ordered in UC Medications - No data to display  Initial Impression / Assessment and Plan / UC Course  I have reviewed the triage vital signs and the nursing notes.  Pertinent labs & imaging results that were available during my care of the patient were reviewed by me and considered in my medical decision making (see chart for details).  Vaginal discharge, screening for STDs.  Patient reports no sexual activity for the past 2 months.  Patient obtained vaginal self swab for testing.  Discussed that we will call her if the test results are positive requiring treatment.  Instructed her to abstain from sexual activity for at least 7 days.  Instructed her to follow-up with her PCP or OB/GYN if her symptoms are not improving.  Patient agrees to plan of care.    Final Clinical Impressions(s) / UC Diagnoses   Final diagnoses:  Vaginal discharge  Screening for STD (sexually transmitted disease)     Discharge Instructions      Your vaginal tests are pending.  If your test results are positive, we will call you.  You may require treatment at that time.  Do not have sexual activity for at least 7 days.    Follow up with your primary  care provider if your symptoms are not improving.         ED Prescriptions   None    PDMP not reviewed this encounter.   Mickie Bail, NP 12/20/20 1921

## 2020-12-20 NOTE — ED Triage Notes (Signed)
Patient presents to Urgent Care with complaints of possible STD testing. Pt complains of vaginal discharge with odor. Pt has a hx of BV. She reports frequent episodes before cycle and after cycle. She reports unprotected sex 2 months ago.   Denies abdominal pain or fever.

## 2020-12-21 LAB — CERVICOVAGINAL ANCILLARY ONLY
Bacterial Vaginitis (gardnerella): POSITIVE — AB
Candida Glabrata: NEGATIVE
Candida Vaginitis: POSITIVE — AB
Chlamydia: NEGATIVE
Comment: NEGATIVE
Comment: NEGATIVE
Comment: NEGATIVE
Comment: NEGATIVE
Comment: NEGATIVE
Comment: NORMAL
Neisseria Gonorrhea: NEGATIVE
Trichomonas: NEGATIVE

## 2020-12-24 ENCOUNTER — Telehealth (HOSPITAL_COMMUNITY): Payer: Self-pay | Admitting: Emergency Medicine

## 2020-12-24 MED ORDER — FLUCONAZOLE 150 MG PO TABS: 150.0000 mg | ORAL_TABLET | Freq: Once | ORAL | 0 refills | Status: AC

## 2020-12-24 MED ORDER — METRONIDAZOLE 500 MG PO TABS: 500.0000 mg | ORAL_TABLET | Freq: Two times a day (BID) | ORAL | 0 refills | Status: DC

## 2021-05-03 ENCOUNTER — Ambulatory Visit
Admission: RE | Admit: 2021-05-03 | Discharge: 2021-05-03 | Disposition: A | Discharge status: Home / Self Care | Payer: BC Managed Care – PPO | Source: Ambulatory Visit | Attending: Internal Medicine | Admitting: Internal Medicine

## 2021-05-03 ENCOUNTER — Other Ambulatory Visit: Payer: Self-pay

## 2021-05-03 VITALS — BP 129/74 | HR 96 | Temp 97.9°F | Resp 18

## 2021-05-03 DIAGNOSIS — Z113 Encounter for screening for infections with a predominantly sexual mode of transmission: Secondary | ICD-10-CM | POA: Diagnosis present

## 2021-05-03 DIAGNOSIS — B3731 Acute candidiasis of vulva and vagina: Secondary | ICD-10-CM | POA: Diagnosis not present

## 2021-05-03 DIAGNOSIS — A5602 Chlamydial vulvovaginitis: Secondary | ICD-10-CM | POA: Diagnosis not present

## 2021-05-03 NOTE — Discharge Instructions (Signed)
Your vaginal swab and blood work is pending.  We will call if it is positive and treat as appropriate.  Please refrain from sexual activity until test results and treatment are complete.

## 2021-05-03 NOTE — ED Provider Notes (Signed)
[facility]    CSN: [id number] Arrival date & time: 05/03/21  1356      History   Chief Complaint Chief Complaint  Patient presents with   2p appt   std testing    HPI Rachel West is a 21 y.o. female.   Patient presents today for routine STD testing.  She denies any symptoms or any recent exposure.  She is requesting blood work for HIV and syphilis as well.    Past Medical History:  Diagnosis Date   Allergy    Chlamydia     Patient Active Problem List   Diagnosis Date Noted   Allergic rhinitis 08/20/2014   RAD (reactive airway disease) 03/22/2012    History reviewed. No pertinent surgical history.  OB History   No obstetric history on file.      Home Medications    Prior to Admission medications   Medication Sig Start Date End Date Taking? Authorizing Provider  metroNIDAZOLE (FLAGYL) 500 MG tablet Take 1 tablet (500 mg total) by mouth 2 (two) times daily. 12/24/20   Lamptey, Britta Mccreedy, MD  nitrofurantoin, macrocrystal-monohydrate, (MACROBID) 100 MG capsule Take 1 capsule (100 mg total) by mouth 2 (two) times daily. 07/29/20   Lurline Idol, FNP  norgestimate-ethinyl estradiol (ORTHO-CYCLEN) 0.25-35 MG-MCG tablet Take 1 tablet by mouth daily.    [provider]  albuterol (PROVENTIL HFA;VENTOLIN HFA) 108 (90 BASE) MCG/ACT inhaler Inhale 2 puffs into the lungs every 6 (six) hours as needed for wheezing. Patient not taking: Reported on 05/02/2018 08/20/14 11/29/18  Elvina Sidle, MD  fluticasone [facility]) 50 MCG/ACT nasal spray Place 2 sprays into both nostrils daily. Patient not taking: Reported on 05/02/2018 08/20/14 11/29/18  Elvina Sidle, MD    Family History Family History  Problem Relation Age of Onset   Diabetes Mother    Hyperlipidemia Father     Social History Social History   Tobacco Use   Smoking status: Never   Smokeless tobacco: Never  Vaping Use   Vaping Use: Never used  Substance Use Topics   Alcohol use:  Not Currently   Drug use: Never     Allergies   Patient has no known allergies.   Review of Systems Review of Systems Per HPI  Physical Exam Triage Vital Signs ED Triage Vitals  Enc Vitals Group     BP 05/03/21 1406 129/74     Pulse Rate 05/03/21 1406 96     Resp 05/03/21 1406 18     Temp 05/03/21 1406 97.9 F (36.6 C)     Temp Source 05/03/21 1406 Oral     SpO2 05/03/21 1406 97 %     Weight --      Height --      Head Circumference --      Peak Flow --      Pain Score 05/03/21 1407 0     Pain Loc --      Pain Edu? --      Excl. in GC? --    No data found.  Updated Vital Signs BP 129/74 (BP Location: Right Arm)    Pulse 96    Temp 97.9 F (36.6 C) (Oral)    Resp 18    LMP 04/26/2021 (Exact Date)    SpO2 97%   Visual Acuity Right Eye Distance:   Left Eye Distance:   Bilateral Distance:    Right Eye Near:   Left Eye Near:    Bilateral Near:  Physical Exam Constitutional:      General: She is not in acute distress.    Appearance: Normal appearance. She is not toxic-appearing or diaphoretic.  HENT:     Head: Normocephalic and atraumatic.  Eyes:     Extraocular Movements: Extraocular movements intact.     Conjunctiva/sclera: Conjunctivae normal.  Pulmonary:     Effort: Pulmonary effort is normal.  Genitourinary:    Comments: Deferred with shared decision making.  Self swab performed. Neurological:     General: No focal deficit present.     Mental Status: She is alert and oriented to person, place, and time. Mental status is at baseline.  Psychiatric:        Mood and Affect: Mood normal.        Behavior: Behavior normal.        Thought Content: Thought content normal.        Judgment: Judgment normal.     UC Treatments / Results  Labs (all labs ordered are listed, but only abnormal results are displayed) Labs Reviewed  HIV ANTIBODY (ROUTINE TESTING W REFLEX)  RPR  CERVICOVAGINAL ANCILLARY ONLY    EKG   Radiology No results  found.  Procedures Procedures (including critical care time)  Medications Ordered in UC Medications - No data to display  Initial Impression / Assessment and Plan / UC Course  I have reviewed the triage vital signs and the nursing notes.  Pertinent labs & imaging results that were available during my care of the patient were reviewed by me and considered in my medical decision making (see chart for details).     Routine STD testing pending per patient request.  Cervicovaginal swab pending.  Blood work for HIV and syphilis pending.  Patient to refrain from sexual activity until test results and treatment are complete.  Patient verbalized understanding and was agreeable with plan. Final Clinical Impressions(s) / UC Diagnoses   Final diagnoses:  Screening examination for venereal disease     Discharge Instructions      Your vaginal swab and blood work is pending.  We will call if it is positive and treat as appropriate.  Please refrain from sexual activity until test results and treatment are complete.    ED Prescriptions   None    PDMP not reviewed this encounter.   Gustavus Bryant, [place] 05/03/21 1419

## 2021-05-03 NOTE — ED Triage Notes (Signed)
Pt presents today for STD testing including blood work. Denies sxs. 

## 2021-05-04 LAB — HIV ANTIBODY (ROUTINE TESTING W REFLEX): HIV Screen 4th Generation wRfx: NONREACTIVE

## 2021-05-04 LAB — RPR: RPR Ser Ql: NONREACTIVE

## 2021-05-06 LAB — CERVICOVAGINAL ANCILLARY ONLY
Bacterial Vaginitis (gardnerella): NEGATIVE
Candida Glabrata: NEGATIVE
Candida Vaginitis: POSITIVE — AB
Chlamydia: POSITIVE — AB
Comment: NEGATIVE
Comment: NEGATIVE
Comment: NEGATIVE
Comment: NEGATIVE
Comment: NEGATIVE
Comment: NORMAL
Neisseria Gonorrhea: NEGATIVE
Trichomonas: NEGATIVE

## 2021-05-07 ENCOUNTER — Telehealth (HOSPITAL_COMMUNITY): Payer: Self-pay | Admitting: Emergency Medicine

## 2021-05-07 MED ORDER — DOXYCYCLINE HYCLATE 100 MG PO CAPS: 100.0000 mg | ORAL_CAPSULE | Freq: Two times a day (BID) | ORAL | 0 refills | Status: AC

## 2021-05-07 MED ORDER — FLUCONAZOLE 150 MG PO TABS: 150.0000 mg | ORAL_TABLET | Freq: Once | ORAL | 0 refills | Status: AC

## 2021-06-01 ENCOUNTER — Other Ambulatory Visit: Payer: Self-pay

## 2021-06-01 ENCOUNTER — Ambulatory Visit
Admission: EM | Admit: 2021-06-01 | Discharge: 2021-06-01 | Disposition: A | Discharge status: Home / Self Care | Payer: BC Managed Care – PPO | Attending: Physician Assistant | Admitting: Physician Assistant

## 2021-06-01 ENCOUNTER — Encounter: Payer: Self-pay | Admitting: *Deleted

## 2021-06-01 DIAGNOSIS — Z202 Contact with and (suspected) exposure to infections with a predominantly sexual mode of transmission: Secondary | ICD-10-CM

## 2021-06-01 DIAGNOSIS — Z113 Encounter for screening for infections with a predominantly sexual mode of transmission: Secondary | ICD-10-CM

## 2021-06-01 DIAGNOSIS — Z8619 Personal history of other infectious and parasitic diseases: Secondary | ICD-10-CM

## 2021-06-01 LAB — POCT URINALYSIS DIP (MANUAL ENTRY)
Bilirubin, UA: NEGATIVE
Blood, UA: NEGATIVE
Glucose, UA: NEGATIVE mg/dL
Ketones, POC UA: NEGATIVE mg/dL
Leukocytes, UA: NEGATIVE
Nitrite, UA: NEGATIVE
Spec Grav, UA: >=1.03 — AB (ref 1.010–1.025)
Urobilinogen, UA: 0.2 E.U./dL
pH, UA: 6 (ref 5.0–8.0)

## 2021-06-01 LAB — POCT URINE PREGNANCY: Preg Test, Ur: NEGATIVE

## 2021-06-01 NOTE — ED Triage Notes (Signed)
Pt reports being treated for chlamydia approx 1 month ago, but lost one of the abx pills, so she would like to be checked again to ensure infection cleared up. Denies any abd pain or vaginal discharge. ?Also c/o some dysuria x approx 3 days. ?

## 2021-06-01 NOTE — Discharge Instructions (Signed)
We will contact you if your results are positive and we need to arrange any additional treatment.  Monitor your MyChart for these results.  Please abstain from sex until you receive results.  Make sure to use a condom with each sexual encounter.  If you develop any symptoms including abdominal pain, pelvic pain, fever, nausea, vomiting you need to be seen immediately. ?

## 2021-06-01 NOTE — ED Provider Notes (Signed)
?[facility] ? ? ? ?CSN: [id number] ?Arrival date & time: 06/01/21  1226 ? ? ?  ? ?History   ?Chief Complaint ?Chief Complaint  ?Patient presents with  ? STI check  ? Dysuria  ? ? ?HPI ?Rachel West is a 21 y.o. female.  ? ?Patient presents today requesting repeat STI testing.  She was seen on 05/03/2021 at which point she tested positive for bacterial vaginosis and chlamydia.  She was started on doxycycline 100 mg twice daily and reports she lost the final pill and is concerned that she might have persistent infection as a result.  She reports taking 13 out of 14 doses without difficulty.  She denies any symptoms including vaginal discharge, vaginal bleeding, pelvic pain, abdominal pain, fever, nausea, vomiting.  She denies any urinary symptoms including frequency, urgency, significant dysuria.  She denies additional antibiotic use.  She had negative HIV and RPR testing in February so will not repeat this today. ? ? ?Past Medical History:  ?Diagnosis Date  ? Allergy   ? Chlamydia   ? ? ?Patient Active Problem List  ? Diagnosis Date Noted  ? Allergic rhinitis 08/20/2014  ? RAD (reactive airway disease) 03/22/2012  ? ? ?History reviewed. No pertinent surgical history. ? ?OB History   ?No obstetric history on file. ?  ? ? ? ?Home Medications   ? ?Prior to Admission medications   ?Medication Sig Start Date End Date Taking? Authorizing Provider  ?MULTIPLE VITAMINS PO Take by mouth.   Yes [provider]  ?norgestimate-ethinyl estradiol (ORTHO-CYCLEN) 0.25-35 MG-MCG tablet Take 1 tablet by mouth daily.   Yes [provider]  ?albuterol (PROVENTIL HFA;VENTOLIN HFA) 108 (90 BASE) MCG/ACT inhaler Inhale 2 puffs into the lungs every 6 (six) hours as needed for wheezing. ?Patient not taking: Reported on 05/02/2018 08/20/14 11/29/18  Elvina Sidle, MD  ?fluticasone [facility]) 50 MCG/ACT nasal spray Place 2 sprays into both nostrils daily. ?Patient not taking: Reported on 05/02/2018 08/20/14 11/29/18   Elvina Sidle, MD  ? ? ?Family History ?Family History  ?Problem Relation Age of Onset  ? Diabetes Mother   ? Hyperlipidemia Father   ? ? ?Social History ?Social History  ? ?Tobacco Use  ? Smoking status: Never  ? Smokeless tobacco: Never  ?Vaping Use  ? Vaping Use: Never used  ?Substance Use Topics  ? Alcohol use: Not Currently  ? Drug use: Never  ? ? ? ?Allergies   ?Patient has no known allergies. ? ? ?Review of Systems ?Review of Systems  ?Constitutional:  Negative for activity change, appetite change, fatigue and fever.  ?Respiratory:  Negative for cough and shortness of breath.   ?Cardiovascular:  Negative for chest pain.  ?Gastrointestinal:  Negative for abdominal pain, diarrhea, nausea and vomiting.  ?Genitourinary:  Negative for dysuria, frequency, pelvic pain, urgency, vaginal bleeding, vaginal discharge and vaginal pain.  ? ? ?Physical Exam ?Triage Vital Signs ?ED Triage Vitals  ?Enc Vitals Group  ?   BP 06/01/21 1242 114/70  ?   Pulse Rate 06/01/21 1242 93  ?   Resp 06/01/21 1242 16  ?   Temp 06/01/21 1242 97.8 ?F (36.6 ?C)  ?   Temp Source 06/01/21 1242 Oral  ?   SpO2 06/01/21 1242 97 %  ?   Weight --   ?   Height --   ?   Head Circumference --   ?   Peak Flow --   ?   Pain Score 06/01/21 1244 0  ?  Pain Loc --   ?   Pain Edu? --   ?   Excl. in GC? --   ? ?No data found. ? ?Updated Vital Signs ?BP 114/70   Pulse 93   Temp 97.8 ?F (36.6 ?C) (Oral)   Resp 16   LMP 05/21/2021 (Exact Date)   SpO2 97%  ? ?Visual Acuity ?Right Eye Distance:   ?Left Eye Distance:   ?Bilateral Distance:   ? ?Right Eye Near:   ?Left Eye Near:    ?Bilateral Near:    ? ?Physical Exam ?Vitals reviewed.  ?Constitutional:   ?   General: She is awake. She is not in acute distress. ?   Appearance: Normal appearance. She is well-developed. She is not ill-appearing.  ?   Comments: Very pleasant female appears stated age in no acute distress sitting comfortably in exam room  ?HENT:  ?   Head: Normocephalic and atraumatic.   ?Cardiovascular:  ?   Rate and Rhythm: Normal rate and regular rhythm.  ?   Heart sounds: Normal heart sounds, S1 normal and S2 normal. No murmur heard. ?Pulmonary:  ?   Effort: Pulmonary effort is normal.  ?   Breath sounds: Normal breath sounds. No wheezing, rhonchi or rales.  ?   Comments: Clear to auscultation bilaterally ?Abdominal:  ?   General: Bowel sounds are normal.  ?   Palpations: Abdomen is soft.  ?   Tenderness: There is no abdominal tenderness. There is no right CVA tenderness, left CVA tenderness, guarding or rebound.  ?   Comments: Benign abdominal exam  ?Genitourinary: ?   Comments: Exam deferred ?Psychiatric:     ?   Behavior: Behavior is cooperative.  ? ? ? ?UC Treatments / Results  ?Labs ?(all labs ordered are listed, but only abnormal results are displayed) ?Labs Reviewed  ?POCT URINALYSIS DIP (MANUAL ENTRY) - Abnormal; Notable for the following components:  ?    Result Value  ? Clarity, UA hazy (*)   ? Spec Grav, UA >=1.030 (*)   ? Protein Ur, POC trace (*)   ? All other components within normal limits  ?POCT URINE PREGNANCY  ?CERVICOVAGINAL ANCILLARY ONLY  ? ? ?EKG ? ? ?Radiology ?No results found. ? ?Procedures ?Procedures (including critical care time) ? ?Medications Ordered in UC ?Medications - No data to display ? ?Initial Impression / Assessment and Plan / UC Course  ?I have reviewed the triage vital signs and the nursing notes. ? ?Pertinent labs & imaging results that were available during my care of the patient were reviewed by me and considered in my medical decision making (see chart for details). ? ?  ? ?Discussed that she likely had enough antibiotic to complete course of treatment.  We will provide test of cure today but discussed that typically we like to wait till 4 weeks after completing treatment for test of cure and so if it is positive we may consider repeating treatment and then waiting a minimum of 4 to 6 weeks for repeat testing.  UA was obtained today that showed  increased specific Gravity and recommended she increase fluid consumption.  No evidence of infection.  Urine pregnancy was negative.  Discussed the importance of safe sex practices.  All questions answered to patient satisfaction. ? ?Final Clinical Impressions(s) / UC Diagnoses  ? ?Final diagnoses:  ?Routine screening for STI (sexually transmitted infection)  ?History of chlamydia  ? ? ? ?Discharge Instructions   ? ?  ?We will contact you if  your results are positive and we need to arrange any additional treatment.  Monitor your MyChart for these results.  Please abstain from sex until you receive results.  Make sure to use a condom with each sexual encounter.  If you develop any symptoms including abdominal pain, pelvic pain, fever, nausea, vomiting you need to be seen immediately. ? ? ? ? ?ED Prescriptions   ?None ?  ? ?PDMP not reviewed this encounter. ?  ?Jeani HawkingRaspet, Maebell Lyvers K, PA-C ?06/01/21 1329 ? ?

## 2021-06-03 LAB — CERVICOVAGINAL ANCILLARY ONLY
Bacterial Vaginitis (gardnerella): NEGATIVE
Candida Glabrata: NEGATIVE
Candida Vaginitis: NEGATIVE
Chlamydia: NEGATIVE
Comment: NEGATIVE
Comment: NEGATIVE
Comment: NEGATIVE
Comment: NEGATIVE
Comment: NEGATIVE
Comment: NORMAL
Neisseria Gonorrhea: NEGATIVE
Trichomonas: NEGATIVE

## 2021-09-09 ENCOUNTER — Ambulatory Visit
Admission: RE | Admit: 2021-09-09 | Discharge: 2021-09-09 | Disposition: A | Discharge status: Home / Self Care | Payer: BC Managed Care – PPO | Source: Ambulatory Visit | Attending: Urgent Care | Admitting: Urgent Care

## 2021-09-09 VITALS — BP 109/69 | HR 93 | Temp 98.5°F | Resp 20

## 2021-09-09 DIAGNOSIS — Z9189 Other specified personal risk factors, not elsewhere classified: Secondary | ICD-10-CM | POA: Insufficient documentation

## 2021-09-09 NOTE — ED Triage Notes (Signed)
Pt here for routine STI check. No sxs. Would like blood work. No chance of pregnancy.

## 2021-09-09 NOTE — ED Provider Notes (Signed)
  [facility]   MRN: [id number] DOB: 06/22/2000  Subjective:   Rachel West is a 21 y.o. female presenting for STI screen. LMP was 08/26/2021, she is on OCP. She is sexually active, 1 female partner, condoms used for protection. Denies fever, n/v, abdominal pain, pelvic pain, rashes, dysuria, urinary frequency, hematuria, vaginal discharge.    No current facility-administered medications for this encounter.  Current Outpatient Medications:    MULTIPLE VITAMINS PO, Take by mouth., Disp: , Rfl:    norgestimate-ethinyl estradiol (ORTHO-CYCLEN) 0.25-35 MG-MCG tablet, Take 1 tablet by mouth daily., Disp: , Rfl:    No Known Allergies  Past Medical History:  Diagnosis Date   Allergy    Chlamydia      History reviewed. No pertinent surgical history.  Family History  Problem Relation Age of Onset   Diabetes Mother    Hyperlipidemia Father     Social History   Tobacco Use   Smoking status: Never   Smokeless tobacco: Never  Vaping Use   Vaping Use: Never used  Substance Use Topics   Alcohol use: Not Currently   Drug use: Never    ROS   Objective:   Vitals: BP 109/69   Pulse 93   Temp 98.5 F (36.9 C)   Resp 20   SpO2 98%   Physical Exam Constitutional:      General: She is not in acute distress.    Appearance: Normal appearance. She is well-developed. She is not ill-appearing, toxic-appearing or diaphoretic.  HENT:     Head: Normocephalic and atraumatic.     Nose: Nose normal.     Mouth/Throat:     Mouth: Mucous membranes are moist.  Eyes:     General: No scleral icterus.       Right eye: No discharge.        Left eye: No discharge.     Extraocular Movements: Extraocular movements intact.  Cardiovascular:     Rate and Rhythm: Normal rate.  Pulmonary:     Effort: Pulmonary effort is normal.  Skin:    General: Skin is warm and dry.  Neurological:     General: No focal deficit present.     Mental Status: She is alert and  oriented to person, place, and time.  Psychiatric:        Mood and Affect: Mood normal.        Behavior: Behavior normal.     Assessment and Plan :   PDMP not reviewed this encounter.  1. At risk for sexually transmitted disease due to unprotected sex     Will treat as appropriate based off of lab results.  Patient declined her AVS printout.   Wallis Bamberg, PA-C 09/09/21 1159

## 2021-09-10 LAB — RPR: RPR Ser Ql: NONREACTIVE

## 2021-09-10 LAB — HIV ANTIBODY (ROUTINE TESTING W REFLEX): HIV Screen 4th Generation wRfx: NONREACTIVE

## 2021-09-11 LAB — CERVICOVAGINAL ANCILLARY ONLY
Bacterial Vaginitis (gardnerella): NEGATIVE
Candida Glabrata: NEGATIVE
Candida Vaginitis: NEGATIVE
Chlamydia: NEGATIVE
Comment: NEGATIVE
Comment: NEGATIVE
Comment: NEGATIVE
Comment: NEGATIVE
Comment: NEGATIVE
Comment: NORMAL
Neisseria Gonorrhea: NEGATIVE
Trichomonas: NEGATIVE

## 2022-01-04 ENCOUNTER — Ambulatory Visit (HOSPITAL_COMMUNITY)
Admission: RE | Admit: 2022-01-04 | Discharge: 2022-01-04 | Disposition: A | Discharge status: Home / Self Care | Payer: BC Managed Care – PPO | Source: Ambulatory Visit

## 2022-01-04 ENCOUNTER — Encounter (HOSPITAL_COMMUNITY): Payer: Self-pay

## 2022-01-04 VITALS — BP 104/67 | HR 99 | Temp 99.6°F | Resp 16

## 2022-01-04 DIAGNOSIS — R0981 Nasal congestion: Secondary | ICD-10-CM

## 2022-01-04 DIAGNOSIS — B2790 Infectious mononucleosis, unspecified without complication: Secondary | ICD-10-CM

## 2022-01-04 LAB — POCT INFECTIOUS MONO SCREEN, ED / UC: Mono Screen: POSITIVE — AB

## 2022-01-04 LAB — POCT RAPID STREP A, ED / UC: Streptococcus, Group A Screen (Direct): NEGATIVE

## 2022-01-04 MED ORDER — IBUPROFEN 800 MG PO TABS
800.0000 mg | ORAL_TABLET | Freq: Once | ORAL | Status: AC
  Administered 2022-01-04: 800 mg via ORAL

## 2022-01-04 MED ORDER — IBUPROFEN 800 MG PO TABS: 800.0000 mg | ORAL_TABLET | Freq: Three times a day (TID) | ORAL | 0 refills | Status: AC

## 2022-01-04 MED ORDER — IBUPROFEN 800 MG PO TABS
ORAL_TABLET | ORAL | Status: AC
  Filled 2022-01-04: qty 1

## 2022-01-04 NOTE — ED Triage Notes (Signed)
Patient having tonsillar swelling x 3 days.   Congestion in the face and SOB x 3 weeks. No known sick exposure, no one at home with similar symptoms. Patient has history of bad seasonal allergies.

## 2022-01-04 NOTE — Discharge Instructions (Addendum)
You have mononucleosis which is a viral infection caused by the Epstein-Barr virus.  This will improve on its own over time with rest, fluids, and anti-inflammatory medicines.  You may continue gargling with warm salt water to help with your throat pain.  Continue taking Mucinex to help with your nasal congestion.  Take ibuprofen 800 mg every 8 hours as needed for inflammation and pain.  Take this medicine with food to avoid stomach upset.  If you develop abdominal pain, specifically to the left upper side of your stomach, please return to urgent care or go to the emergency room if your pain is severe. Avoid participating in contact sports or activities where you are at risk for trauma to the stomach for the next 6 weeks.   If you develop any new or worsening symptoms or do not improve in the next 2 to 3 days, please return.  If your symptoms are severe, please go to the emergency room.  Follow-up with your primary care provider for further evaluation and management of your symptoms as well as ongoing wellness visits.  I hope you feel better!

## 2022-01-04 NOTE — ED Provider Notes (Signed)
[facility]    CSN: [id number] Arrival date & time: 01/04/22  1546      History   Chief Complaint Chief Complaint  Patient presents with   Nasal Congestion   Sore Throat    HPI Rachel West is a 21 y.o. female.   Patient presents urgent care for evaluation of nasal congestion that has been present for the last 3 weeks and has not responded well to over-the-counter treatment.  Sore throat began 3 days ago and she reports bilateral tonsil swelling and white spots to the back of the throat.  Throat pain causes painful swallowing and is currently a 6 on a scale of 0-10.  Ears are beginning to become mildly painful.  Denies headache, fever/chills, dizziness, tinnitus, decreased hearing, blurry vision, neck pain, chest pain, shortness of breath, cough, and abdominal pain.  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  No recent antibiotic use, no rash, and no recent steroid use.  She has not attempted use of any over-the-counter medications prior to arrival urgent care for throat pain.  Reports history of "bad allergies" and began to take Zyrtec at nighttime and use Flonase daily 3 weeks ago when nasal congestion started.  She has also been taking Mucinex over-the-counter at home and states that this has not been helping very much with her nasal congestion.  Denies history of asthma or respiratory problems.   Sore Throat    Past Medical History:  Diagnosis Date   Allergy    Chlamydia     Patient Active Problem List   Diagnosis Date Noted   Allergic rhinitis 08/20/2014   RAD (reactive airway disease) 03/22/2012    History reviewed. No pertinent surgical history.  OB History   No obstetric history on file.      Home Medications    Prior to Admission medications   Medication Sig Start Date End Date Taking? Authorizing Provider  Cetirizine HCl (ZYRTEC ALLERGY PO) Take by mouth.   Yes [provider]  ibuprofen (ADVIL) 800 MG tablet Take 1 tablet (800 mg total) by mouth 3 (three) times daily. 01/04/22  Yes Talbot Grumbling, FNP  MULTIPLE VITAMINS PO Take by mouth.    [provider]  norgestimate-ethinyl estradiol (ORTHO-CYCLEN) 0.25-35 MG-MCG tablet Take 1 tablet by mouth daily.    [provider]  albuterol (PROVENTIL HFA;VENTOLIN HFA) 108 (90 BASE) MCG/ACT inhaler Inhale 2 puffs into the lungs every 6 (six) hours as needed for wheezing. Patient not taking: Reported on 05/02/2018 08/20/14 11/29/18  Robyn Haber, MD  fluticasone [facility]) 50 MCG/ACT nasal spray Place 2 sprays into both nostrils daily. Patient not taking: Reported on 05/02/2018 08/20/14 11/29/18  Robyn Haber, MD    Family History Family History  Problem Relation Age of Onset   Diabetes Mother    Hyperlipidemia Father     Social History Social History   Tobacco Use   Smoking status: Never   Smokeless tobacco: Never  Vaping Use   Vaping Use: Never used  Substance Use Topics   Alcohol use: Not Currently   Drug use: Never     Allergies   Patient has no known allergies.   Review of Systems Review of Systems Per HPI  Physical Exam Triage Vital Signs ED Triage Vitals  Enc Vitals Group     BP 01/04/22 1605 104/67     Pulse Rate 01/04/22 1605 (!) 127  Resp 01/04/22 1605 16     Temp 01/04/22 1605 99.6 F (37.6 C)     Temp Source 01/04/22 1605 Oral     SpO2 01/04/22 1605 96 %     Weight --      Height --      Head Circumference --      Peak Flow --      Pain Score 01/04/22 1604 6     Pain Loc --      Pain Edu? --      Excl. in [place]? --    No data found.  Updated Vital Signs BP 104/67 (BP Location: Right Arm)   Pulse 99   Temp 99.6 F (37.6 C) (Oral)   Resp 16   LMP 12/29/2021 (Exact Date)   SpO2 96%   Visual Acuity Right Eye Distance:   Left Eye Distance:   Bilateral Distance:    Right Eye Near:   Left Eye Near:    Bilateral Near:      Physical Exam Vitals and nursing note reviewed.  Constitutional:      Appearance: Normal appearance. She is not ill-appearing or toxic-appearing.  HENT:     Head: Normocephalic and atraumatic.     Right Ear: Hearing, ear canal and external ear normal. Tympanic membrane is erythematous.     Left Ear: Hearing, ear canal and external ear normal. Tympanic membrane is erythematous.     Nose: Congestion and rhinorrhea present.     Mouth/Throat:     Lips: Pink.     Mouth: Mucous membranes are moist.     Dentition: Normal dentition.     Tongue: No lesions. Tongue does not deviate from midline.     Palate: No mass and lesions.     Pharynx: Uvula midline. Pharyngeal swelling, oropharyngeal exudate and posterior oropharyngeal erythema present. No uvula swelling.     Tonsils: Tonsillar exudate present. 2+ on the right. 2+ on the left.  Eyes:     General: Lids are normal. Vision grossly intact. Gaze aligned appropriately.        Right eye: No discharge.        Left eye: No discharge.     Extraocular Movements: Extraocular movements intact.     Conjunctiva/sclera: Conjunctivae normal.     Pupils: Pupils are equal, round, and reactive to light.  Cardiovascular:     Rate and Rhythm: Normal rate and regular rhythm.     Heart sounds: Normal heart sounds, S1 normal and S2 normal.  Pulmonary:     Effort: Pulmonary effort is normal. No respiratory distress.     Breath sounds: Normal breath sounds and air entry.  Abdominal:     General: Abdomen is flat. Bowel sounds are normal.     Palpations: Abdomen is soft.     Tenderness: There is no abdominal tenderness.  Musculoskeletal:     Cervical back: Neck supple.  Lymphadenopathy:     Cervical: Cervical adenopathy present.  Skin:    General: Skin is warm and dry.     Capillary Refill: Capillary refill takes less than 2 seconds.     Findings: No rash.  Neurological:     General: No focal deficit present.     Mental Status: She is alert and  oriented to person, place, and time. Mental status is at baseline.     Cranial Nerves: No dysarthria or facial asymmetry.     Motor: No weakness.     Gait: Gait normal.  Psychiatric:  Mood and Affect: Mood normal.        Speech: Speech normal.        Behavior: Behavior normal.        Thought Content: Thought content normal.        Judgment: Judgment normal.      UC Treatments / Results  Labs (all labs ordered are listed, but only abnormal results are displayed) Labs Reviewed  POCT INFECTIOUS MONO SCREEN, ED / UC - Abnormal; Notable for the following components:      Result Value   Mono Screen POSITIVE (*)    All other components within normal limits  POCT RAPID STREP A, ED / UC    EKG   Radiology No results found.  Procedures Procedures (including critical care time)  Medications Ordered in UC Medications  ibuprofen (ADVIL) tablet 800 mg (800 mg Oral Given 01/04/22 1718)    Initial Impression / Assessment and Plan / UC Course  I have reviewed the triage vital signs and the nursing notes.  Pertinent labs & imaging results that were available during my care of the patient were reviewed by me and considered in my medical decision making (see chart for details).   1.  Epstein-Barr virus infection Group A strep testing performed and is negative in clinic.  Mono testing performed and is positive.  Patient does not currently have any abdominal pain and is clinically well-appearing.  Vital signs are hemodynamically stable.  Slight low-grade temperature at 99.6.  Heart rate decreased from 127 to 99 while waiting for results.  Encouraged patient to increase water intake to at least 64 ounces of water per day.  She may continue salt water gargles and warm tea with honey to improve sore throat.  Ibuprofen 800 mg given in clinic to reduce swelling and pain.  She may take 800 mg of ibuprofen every 8 hours as needed for pain and swelling to the throat.  Recommend PCP follow-up in  the next 2 to 3 weeks to ensure that symptoms have improved.  Contact sports precautions given and patient to avoid any activities that could cause stomach pain/trauma for the next 6 weeks while she is healing from mono.  Work note given.  2.  Nasal congestion Patient to continue cetirizine, Flonase, and Mucinex.  PCP follow-up advised if symptoms fail to improve in the next 2 to 3 weeks while mono improves for further evaluation and management of seasonal allergies.   Discussed physical exam and available lab work findings in clinic with patient.  Counseled patient regarding appropriate use of medications and potential side effects for all medications recommended or prescribed today. Discussed red flag signs and symptoms of worsening condition,when to call the PCP office, return to urgent care, and when to seek higher level of care in the emergency department. Patient verbalizes understanding and agreement with plan. All questions answered. Patient discharged in stable condition.   Final Clinical Impressions(s) / UC Diagnoses   Final diagnoses:  Malachi Carl virus infection  Pharyngitis due to infectious mononucleosis  Nasal congestion     Discharge Instructions      You have mononucleosis which is a viral infection caused by the Epstein-Barr virus.  This will improve on its own over time with rest, fluids, and anti-inflammatory medicines.  You may continue gargling with warm salt water to help with your throat pain.  Continue taking Mucinex to help with your nasal congestion.  Take ibuprofen 800 mg every 8 hours as needed for inflammation and pain.  Take this medicine with food to avoid stomach upset.  If you develop abdominal pain, specifically to the left upper side of your stomach, please return to urgent care or go to the emergency room if your pain is severe. Avoid participating in contact sports or activities where you are at risk for trauma to the stomach for the next 6 weeks.    If you develop any new or worsening symptoms or do not improve in the next 2 to 3 days, please return.  If your symptoms are severe, please go to the emergency room.  Follow-up with your primary care provider for further evaluation and management of your symptoms as well as ongoing wellness visits.  I hope you feel better!    ED Prescriptions     Medication Sig Dispense Auth. Provider   ibuprofen (ADVIL) 800 MG tablet Take 1 tablet (800 mg total) by mouth 3 (three) times daily. 21 tablet Talbot Grumbling, FNP      PDMP not reviewed this encounter.   Talbot Grumbling, Okay 01/04/22 1754

## 2022-03-29 ENCOUNTER — Ambulatory Visit
Admission: RE | Admit: 2022-03-29 | Discharge: 2022-03-29 | Disposition: A | Discharge status: Home / Self Care | Payer: 59 | Source: Ambulatory Visit | Attending: Emergency Medicine | Admitting: Emergency Medicine

## 2022-03-29 VITALS — BP 121/77 | HR 101 | Temp 98.7°F | Resp 18 | Ht 65.0 in | Wt 167.0 lb

## 2022-03-29 DIAGNOSIS — B3731 Acute candidiasis of vulva and vagina: Secondary | ICD-10-CM | POA: Diagnosis not present

## 2022-03-29 DIAGNOSIS — N898 Other specified noninflammatory disorders of vagina: Secondary | ICD-10-CM | POA: Insufficient documentation

## 2022-03-29 DIAGNOSIS — Z113 Encounter for screening for infections with a predominantly sexual mode of transmission: Secondary | ICD-10-CM

## 2022-03-29 NOTE — ED Triage Notes (Signed)
Patient states that she is having vaginal discharge x 1 day, odor, pt requesting STI testing.  Denies any bloodwork, just GC/Chlamydia testing.  Patient is currently on her cycle.

## 2022-03-29 NOTE — Discharge Instructions (Addendum)
We will call you if anything on your swab returns positive. Please abstain from sexual intercourse until your results return. 

## 2022-03-29 NOTE — ED Provider Notes (Signed)
[facility]    CSN: [id number] Arrival date & time: 03/29/22  1410     History   Chief Complaint Chief Complaint  Patient presents with   SEXUALLY TRANSMITTED DISEASE    no symptoms, - Entered by patient    HPI Rachel West is a 22 y.o. female.  Presents for STD testing 1 day history of vaginal discharge with odor No known exposures Declines blood work today  LMP is now  Past Medical History:  Diagnosis Date   Allergy    Chlamydia     Patient Active Problem List   Diagnosis Date Noted   Allergic rhinitis 08/20/2014   RAD (reactive airway disease) 03/22/2012    History reviewed. No pertinent surgical history.  OB History   No obstetric history on file.      Home Medications    Prior to Admission medications   Medication Sig Start Date End Date Taking? Authorizing Provider  Cetirizine HCl (ZYRTEC ALLERGY PO) Take by mouth.   Yes [provider]  ibuprofen (ADVIL) 800 MG tablet Take 1 tablet (800 mg total) by mouth 3 (three) times daily. 01/04/22  Yes Talbot Grumbling, FNP  MULTIPLE VITAMINS PO Take by mouth.   Yes [provider]  norgestimate-ethinyl estradiol (ORTHO-CYCLEN) 0.25-35 MG-MCG tablet Take 1 tablet by mouth daily.   Yes [provider]  albuterol (PROVENTIL HFA;VENTOLIN HFA) 108 (90 BASE) MCG/ACT inhaler Inhale 2 puffs into the lungs every 6 (six) hours as needed for wheezing. Patient not taking: Reported on 05/02/2018 08/20/14 11/29/18  Robyn Haber, MD  fluticasone [facility]) 50 MCG/ACT nasal spray Place 2 sprays into both nostrils daily. Patient not taking: Reported on 05/02/2018 08/20/14 11/29/18  Robyn Haber, MD    Family History Family History  Problem Relation Age of Onset   Diabetes Mother    Hyperlipidemia Father     Social History Social History   Tobacco Use   Smoking status: Never   Smokeless tobacco: Never  Vaping Use   Vaping Use: Never used  Substance Use Topics    Alcohol use: Not Currently   Drug use: Never     Allergies   Patient has no known allergies.   Review of Systems Review of Systems as per HPI  Physical Exam Triage Vital Signs ED Triage Vitals  Enc Vitals Group     BP      Pulse      Resp      Temp      Temp src      SpO2      Weight      Height      Head Circumference      Peak Flow      Pain Score      Pain Loc      Pain Edu?      Excl. in [place]?    No data found.  Updated Vital Signs BP 121/77 (BP Location: Right Arm)   Pulse (!) 101   Temp 98.7 F (37.1 C) (Oral)   Resp 18   Ht 5\' 5"  (1.651 m)   Wt 167 lb (75.8 kg)   LMP 03/29/2022   SpO2 100%   BMI 27.79 kg/m   Physical Exam Vitals and nursing note reviewed.  Constitutional:      General: She is not in acute distress.    Appearance: Normal appearance.  HENT:     Mouth/Throat:     Pharynx: Oropharynx is clear.  Cardiovascular:  Rate and Rhythm: Normal rate and regular rhythm.     Pulses: Normal pulses.  Pulmonary:     Effort: Pulmonary effort is normal.  Neurological:     Mental Status: She is alert and oriented to person, place, and time.     UC Treatments / Results  Labs (all labs ordered are listed, but only abnormal results are displayed) Labs Reviewed  CERVICOVAGINAL ANCILLARY ONLY    EKG   Radiology No results found.  Procedures Procedures (including critical care time)  Medications Ordered in UC Medications - No data to display  Initial Impression / Assessment and Plan / UC Course  I have reviewed the triage vital signs and the nursing notes.  Pertinent labs & imaging results that were available during my care of the patient were reviewed by me and considered in my medical decision making (see chart for details).  Cytology swab pending.  Treat positive result if indicated Return precautions discussed. Patient agrees to plan  Final Clinical Impressions(s) / UC Diagnoses   Final diagnoses:  Screen for STD  (sexually transmitted disease)     Discharge Instructions      We will call you if anything on your swab returns positive. Please abstain from sexual intercourse until your results return     ED Prescriptions   None    PDMP not reviewed this encounter.   Les Pou, [place] 03/29/22 1513

## 2022-03-31 LAB — CERVICOVAGINAL ANCILLARY ONLY
Bacterial Vaginitis (gardnerella): POSITIVE — AB
Candida Glabrata: NEGATIVE
Candida Vaginitis: POSITIVE — AB
Chlamydia: NEGATIVE
Comment: NEGATIVE
Comment: NEGATIVE
Comment: NEGATIVE
Comment: NEGATIVE
Comment: NEGATIVE
Comment: NORMAL
Neisseria Gonorrhea: NEGATIVE
Trichomonas: NEGATIVE

## 2022-04-01 ENCOUNTER — Telehealth (HOSPITAL_COMMUNITY): Payer: Self-pay | Admitting: Emergency Medicine

## 2022-04-01 MED ORDER — FLUCONAZOLE 150 MG PO TABS: 150.0000 mg | ORAL_TABLET | Freq: Once | ORAL | 0 refills | Status: DC | PRN

## 2022-04-01 MED ORDER — METRONIDAZOLE 500 MG PO TABS: 500.0000 mg | ORAL_TABLET | Freq: Two times a day (BID) | ORAL | 0 refills | Status: AC

## 2022-04-01 NOTE — Telephone Encounter (Signed)
Positive for BV and yeast Sent fluconazole and flagyl to pharmacy Spoke with patient, advised to not use alcohol while taking flagyl No questions at this time

## 2022-04-25 ENCOUNTER — Ambulatory Visit
Admission: RE | Admit: 2022-04-25 | Discharge: 2022-04-25 | Disposition: A | Discharge status: Home / Self Care | Payer: 59 | Source: Ambulatory Visit | Attending: Urgent Care | Admitting: Urgent Care

## 2022-04-25 VITALS — BP 103/70 | HR 97 | Temp 99.4°F | Resp 18

## 2022-04-25 DIAGNOSIS — J4530 Mild persistent asthma, uncomplicated: Secondary | ICD-10-CM

## 2022-04-25 DIAGNOSIS — J309 Allergic rhinitis, unspecified: Secondary | ICD-10-CM

## 2022-04-25 DIAGNOSIS — J40 Bronchitis, not specified as acute or chronic: Secondary | ICD-10-CM | POA: Diagnosis not present

## 2022-04-25 DIAGNOSIS — J329 Chronic sinusitis, unspecified: Secondary | ICD-10-CM

## 2022-04-25 MED ORDER — PREDNISONE 20 MG PO TABS: ORAL_TABLET | ORAL | 0 refills | Status: DC

## 2022-04-25 MED ORDER — CETIRIZINE HCL 10 MG PO TABS: 10.0000 mg | ORAL_TABLET | Freq: Every day | ORAL | 0 refills | Status: AC

## 2022-04-25 MED ORDER — ALBUTEROL SULFATE HFA 108 (90 BASE) MCG/ACT IN AERS: 1.0000 | INHALATION_SPRAY | Freq: Four times a day (QID) | RESPIRATORY_TRACT | 0 refills | Status: AC | PRN

## 2022-04-25 MED ORDER — AMOXICILLIN-POT CLAVULANATE 875-125 MG PO TABS: 1.0000 | ORAL_TABLET | Freq: Two times a day (BID) | ORAL | 0 refills | Status: DC

## 2022-04-25 MED ORDER — PROMETHAZINE-DM 6.25-15 MG/5ML PO SYRP: 5.0000 mL | ORAL_SOLUTION | Freq: Three times a day (TID) | ORAL | 0 refills | Status: DC | PRN

## 2022-04-25 NOTE — ED Provider Notes (Signed)
[facility]  Note:  This document was prepared using Systems analyst and may include unintentional dictation errors.  MRN: [id number] DOB: August 26, 2000  Subjective:   Rachel West is a 22 y.o. female presenting for 1 week history of persistent productive cough with sinus congestion, sinus drainage, chest congestion.  The cough is eliciting chest and back pain.  Has a history of asthma, needs a refill on her albuterol inhaler.  Also has allergic rhinitis and takes Zyrtec and pseudoephedrine daily.  No smoking, vaping, marijuana use.  No current facility-administered medications for this encounter.  Current Outpatient Medications:    Cetirizine HCl (ZYRTEC ALLERGY PO), Take by mouth., Disp: , Rfl:    fluconazole (DIFLUCAN) 150 MG tablet, Take 1 tablet (150 mg total) by mouth once as needed for up to 2 doses (take one pill on day 1, and the second pill 3 days later)., Disp: 2 tablet, Rfl: 0   ibuprofen (ADVIL) 800 MG tablet, Take 1 tablet (800 mg total) by mouth 3 (three) times daily., Disp: 21 tablet, Rfl: 0   MULTIPLE VITAMINS PO, Take by mouth., Disp: , Rfl:    norgestimate-ethinyl estradiol (ORTHO-CYCLEN) 0.25-35 MG-MCG tablet, Take 1 tablet by mouth daily., Disp: , Rfl:    No Known Allergies  Past Medical History:  Diagnosis Date   Allergy    Chlamydia      History reviewed. No pertinent surgical history.  Family History  Problem Relation Age of Onset   Diabetes Mother    Hyperlipidemia Father     Social History   Tobacco Use   Smoking status: Never   Smokeless tobacco: Never  Vaping Use   Vaping Use: Never used  Substance Use Topics   Alcohol use: Yes    Comment: occ   Drug use: Never    ROS   Objective:   Vitals: BP 103/70 (BP Location: Right Arm)   Pulse 97   Temp 99.4 F (37.4 C) (Oral)   Resp 18   LMP 04/22/2022   SpO2 96%   Physical Exam Constitutional:      General: She is not in acute distress.     Appearance: Normal appearance. She is well-developed and normal weight. She is not ill-appearing, toxic-appearing or diaphoretic.  HENT:     Head: Normocephalic and atraumatic.     Right Ear: Tympanic membrane, ear canal and external ear normal. No drainage or tenderness. No middle ear effusion. There is no impacted cerumen. Tympanic membrane is not erythematous or bulging.     Left Ear: Tympanic membrane, ear canal and external ear normal. No drainage or tenderness.  No middle ear effusion. There is no impacted cerumen. Tympanic membrane is not erythematous or bulging.     Nose: Congestion present. No rhinorrhea.     Mouth/Throat:     Mouth: Mucous membranes are moist. No oral lesions.     Pharynx: No pharyngeal swelling, oropharyngeal exudate, posterior oropharyngeal erythema or uvula swelling.     Tonsils: No tonsillar exudate or tonsillar abscesses.     Comments: Thick streaks of postnasal drainage overlying pharynx. Eyes:     General: No scleral icterus.       Right eye: No discharge.        Left eye: No discharge.     Extraocular Movements: Extraocular movements intact.     Right eye: Normal extraocular motion.     Left eye: Normal extraocular motion.     Conjunctiva/sclera: Conjunctivae normal.  Cardiovascular:  Rate and Rhythm: Normal rate and regular rhythm.     Heart sounds: Normal heart sounds. No murmur heard.    No friction rub. No gallop.  Pulmonary:     Effort: Pulmonary effort is normal. No respiratory distress.     Breath sounds: No stridor. Rhonchi (very mild over mid lung fields bilaterally) present. No wheezing or rales.  Chest:     Chest wall: No tenderness.  Musculoskeletal:     Cervical back: Normal range of motion and neck supple.  Lymphadenopathy:     Cervical: No cervical adenopathy.  Skin:    General: Skin is warm and dry.  Neurological:     General: No focal deficit present.     Mental Status: She is alert and oriented to person, place, and time.   Psychiatric:        Mood and Affect: Mood normal.        Behavior: Behavior normal.     Assessment and Plan :   PDMP not reviewed this encounter.  1. Sinobronchitis   2. Allergic rhinitis, unspecified seasonality, unspecified trigger   3. Mild persistent asthma, uncomplicated     Will manage for sinobronchitis with double coverage using Augmentin and prednisone.  This is especially useful in the context of her allergic rhinitis and asthma.  Refilled her albuterol inhaler.  Will defer imaging for now.  Use supportive care otherwise. Counseled patient on potential for adverse effects with medications prescribed/recommended today, ER and return-to-clinic precautions discussed, patient verbalized understanding.    Jaynee Eagles, PA-C 04/25/22 1233

## 2022-04-25 NOTE — ED Triage Notes (Addendum)
Pt c/o prod cough, nasal/chest congestion, chest /back pain x 1 week-NAD-steady gait

## 2022-10-13 ENCOUNTER — Other Ambulatory Visit: Payer: Self-pay

## 2022-10-13 ENCOUNTER — Ambulatory Visit
Admission: RE | Admit: 2022-10-13 | Discharge: 2022-10-13 | Disposition: A | Discharge status: Home / Self Care | Payer: 59 | Source: Ambulatory Visit | Attending: Internal Medicine | Admitting: Internal Medicine

## 2022-10-13 VITALS — BP 102/66 | HR 93 | Temp 98.5°F | Resp 16

## 2022-10-13 DIAGNOSIS — S50861A Insect bite (nonvenomous) of right forearm, initial encounter: Secondary | ICD-10-CM | POA: Diagnosis not present

## 2022-10-13 DIAGNOSIS — S70362A Insect bite (nonvenomous), left thigh, initial encounter: Secondary | ICD-10-CM | POA: Diagnosis not present

## 2022-10-13 DIAGNOSIS — W57XXXA Bitten or stung by nonvenomous insect and other nonvenomous arthropods, initial encounter: Secondary | ICD-10-CM

## 2022-10-13 DIAGNOSIS — S50862A Insect bite (nonvenomous) of left forearm, initial encounter: Secondary | ICD-10-CM | POA: Diagnosis not present

## 2022-10-13 MED ORDER — TRIAMCINOLONE ACETONIDE 0.1 % EX CREA: 1.0000 | TOPICAL_CREAM | Freq: Two times a day (BID) | CUTANEOUS | 0 refills | Status: DC

## 2022-10-13 MED ORDER — MUPIROCIN 2 % EX OINT: 1.0000 | TOPICAL_OINTMENT | Freq: Two times a day (BID) | CUTANEOUS | 0 refills | Status: DC

## 2022-10-13 MED ORDER — FLUCONAZOLE 150 MG PO TABS: 150.0000 mg | ORAL_TABLET | Freq: Every day | ORAL | 0 refills | Status: DC

## 2022-10-13 MED ORDER — DOXYCYCLINE HYCLATE 100 MG PO CAPS: 100.0000 mg | ORAL_CAPSULE | Freq: Two times a day (BID) | ORAL | 0 refills | Status: AC

## 2022-10-13 NOTE — ED Provider Notes (Signed)
[facility]    CSN: [id number] Arrival date & time: 10/13/22  1601      History   Chief Complaint Chief Complaint  Patient presents with   Insect Bite    std screening, no symptoms - Entered by patient    HPI Rachel West is a 22 y.o. female.   Patient presents with various insect bites present to back of left leg and bilateral upper extremities.  States that insect bites to legs occurred occurred in April and have just not improved. Areas to arms have occurred over the past few days.  Reports that she is not sure what is biting her.  Denies any known sick contacts or fever. Denies any tick bites.      Past Medical History:  Diagnosis Date   Allergy    Chlamydia     Patient Active Problem List   Diagnosis Date Noted   Allergic rhinitis 08/20/2014   RAD (reactive airway disease) 03/22/2012    History reviewed. No pertinent surgical history.  OB History   No obstetric history on file.      Home Medications    Prior to Admission medications   Medication Sig Start Date End Date Taking? Authorizing Provider  albuterol (VENTOLIN HFA) 108 (90 Base) MCG/ACT inhaler Inhale 1-2 puffs into the lungs every 6 (six) hours as needed for wheezing or shortness of breath. 04/25/22  Yes Wallis Bamberg, PA-C  cetirizine (ZYRTEC ALLERGY) 10 MG tablet Take 1 tablet (10 mg total) by mouth daily. 04/25/22  Yes Wallis Bamberg, PA-C  doxycycline (VIBRAMYCIN) 100 MG capsule Take 1 capsule (100 mg total) by mouth 2 (two) times daily for 7 days. 10/13/22 10/20/22 Yes Dolores Ewing, Acie Fredrickson, FNP  fluconazole (DIFLUCAN) 150 MG tablet Take 1 tablet (150 mg total) by mouth daily. Take at first sign of vaginal yeast 10/13/22  Yes Ervin Knack E, [place]  MULTIPLE VITAMINS PO Take by mouth.   Yes [provider]  mupirocin ointment (BACTROBAN) 2 % Apply 1 Application topically 2 (two) times daily. Apply to affected areas on legs 10/13/22  Yes [place], [place] E, [place]  norgestimate-ethinyl estradiol  (ORTHO-CYCLEN) 0.25-35 MG-MCG tablet Take 1 tablet by mouth daily.   Yes [provider]  triamcinolone cream (KENALOG) 0.1 % Apply 1 Application topically 2 (two) times daily. Apply to affected areas to arms. 10/13/22  Yes Espyn Radwan, Rolly Salter E, FNP  ibuprofen (ADVIL) 800 MG tablet Take 1 tablet (800 mg total) by mouth 3 (three) times daily. 01/04/22   Carlisle Beers, FNP  predniSONE (DELTASONE) 20 MG tablet Take 2 tablets daily with breakfast. 04/25/22   Wallis Bamberg, PA-C  promethazine-dextromethorphan (PROMETHAZINE-DM) 6.25-15 MG/5ML syrup Take 5 mLs by mouth 3 (three) times daily as needed for cough. 04/25/22   Wallis Bamberg, PA-C  fluticasone (FLONASE) 50 MCG/ACT nasal spray Place 2 sprays into both nostrils daily. Patient not taking: Reported on 05/02/2018 08/20/14 11/29/18  Elvina Sidle, MD    Family History Family History  Problem Relation Age of Onset   Diabetes Mother    Hyperlipidemia Father     Social History Social History   Tobacco Use   Smoking status: Never   Smokeless tobacco: Never  Vaping Use   Vaping status: Never Used  Substance Use Topics   Alcohol use: Yes    Comment: occ   Drug use: Never     Allergies   Patient has no known allergies.   Review of Systems Review of Systems Per HPI  Physical  Exam Triage Vital Signs ED Triage Vitals [10/13/22 1655]  Encounter Vitals Group     BP 102/66     Systolic BP Percentile      Diastolic BP Percentile      Pulse Rate 93     Resp 16     Temp 98.5 F (36.9 C)     Temp src      SpO2 98 %     Weight      Height      Head Circumference      Peak Flow      Pain Score 0     Pain Loc      Pain Education      Exclude from Growth Chart    No data found.  Updated Vital Signs BP 102/66   Pulse 93   Temp 98.5 F (36.9 C)   Resp 16   LMP 10/13/2022   SpO2 98%   Visual Acuity Right Eye Distance:   Left Eye Distance:   Bilateral Distance:    Right Eye Near:   Left Eye Near:    Bilateral  Near:     Physical Exam Constitutional:      General: She is not in acute distress.    Appearance: Normal appearance. She is not toxic-appearing or diaphoretic.  HENT:     Head: Normocephalic and atraumatic.  Eyes:     Extraocular Movements: Extraocular movements intact.     Conjunctiva/sclera: Conjunctivae normal.  Pulmonary:     Effort: Pulmonary effort is normal.  Skin:    Comments: Patient has very small, flat erythematous pinpoint areas scattered throughout bilateral elbows and forearms.  Patient has approximately 1 inch in diameter, singular, mildly swollen circular areas with no purulent drainage present throughout left upper posterior thigh and 2 areas present throughout left calf.  3 areas to leg present in total.  Neurological:     General: No focal deficit present.     Mental Status: She is alert and oriented to person, place, and time. Mental status is at baseline.  Psychiatric:        Mood and Affect: Mood normal.        Behavior: Behavior normal.        Thought Content: Thought content normal.        Judgment: Judgment normal.      UC Treatments / Results  Labs (all labs ordered are listed, but only abnormal results are displayed) Labs Reviewed - No data to display  EKG   Radiology No results found.  Procedures Procedures (including critical care time)  Medications Ordered in UC Medications - No data to display  Initial Impression / Assessment and Plan / UC Course  I have reviewed the triage vital signs and the nursing notes.  Pertinent labs & imaging results that were available during my care of the patient were reviewed by me and considered in my medical decision making (see chart for details).     Patient's skin does appear consistent with possible insect versus spider bite.  Will prescribe triamcinolone to apply to areas on arm as they appear very mildly inflamed.  Areas to posterior leg are mildly inflamed and have been present for longer period  of time, so will cover for infection with topical mupirocin cream and doxycycline to take by mouth.  Patient reports that antibiotics typically give her yeast infection so Diflucan was prescribed.  Advised to monitor closely for any worsening symptoms and follow-up if they occur.  Advised supportive care including cool compresses as well.  Patient verbalized understanding and was agreeable with plan. Final Clinical Impressions(s) / UC Diagnoses   Final diagnoses:  Insect bite, unspecified site, initial encounter     Discharge Instructions      I have prescribed you triamcinolone cream to apply to affected areas on arm and mupirocin cream to apply to effected areas on legs.  I have also prescribed you an antibiotic to take by mouth to cover for any infection associated with insect or spider bite.  Monitor closely for any worsening symptoms or any increased swelling, redness, pus and follow-up sooner if this occurs.    ED Prescriptions     Medication Sig Dispense Auth. Provider   triamcinolone cream (KENALOG) 0.1 % Apply 1 Application topically 2 (two) times daily. Apply to affected areas to arms. 30 g [place], Rolly Salter E, [place]   mupirocin ointment (BACTROBAN) 2 % Apply 1 Application topically 2 (two) times daily. Apply to affected areas on legs 22 g Ervin Knack E, [place]   doxycycline (VIBRAMYCIN) 100 MG capsule Take 1 capsule (100 mg total) by mouth 2 (two) times daily for 7 days. 14 capsule [place], [place] E, [place]   fluconazole (DIFLUCAN) 150 MG tablet Take 1 tablet (150 mg total) by mouth daily. Take at first sign of vaginal yeast 1 tablet [place], Acie Fredrickson, [place]      PDMP not reviewed this encounter.   Gustavus Bryant, [place] 10/13/22 [phone]

## 2022-10-13 NOTE — Discharge Instructions (Addendum)
I have prescribed you triamcinolone cream to apply to affected areas on arm and mupirocin cream to apply to effected areas on legs.  I have also prescribed you an antibiotic to take by mouth to cover for any infection associated with insect or spider bite.  Monitor closely for any worsening symptoms or any increased swelling, redness, pus and follow-up sooner if this occurs.

## 2022-10-13 NOTE — ED Triage Notes (Signed)
Pt reports insect bite on the back of Lt leg that occured 2 months ago. Most recent bite on Rt finger one week ago

## 2022-11-07 ENCOUNTER — Ambulatory Visit
Admission: RE | Admit: 2022-11-07 | Discharge: 2022-11-07 | Disposition: A | Discharge status: Home / Self Care | Payer: 59 | Source: Ambulatory Visit

## 2022-11-07 VITALS — BP 110/72 | HR 92 | Temp 98.9°F | Resp 16

## 2022-11-07 DIAGNOSIS — B86 Scabies: Secondary | ICD-10-CM | POA: Insufficient documentation

## 2022-11-07 DIAGNOSIS — Z113 Encounter for screening for infections with a predominantly sexual mode of transmission: Secondary | ICD-10-CM | POA: Diagnosis not present

## 2022-11-07 MED ORDER — HYDROXYZINE HCL 25 MG PO TABS: 12.5000 mg | ORAL_TABLET | Freq: Three times a day (TID) | ORAL | 0 refills | Status: AC | PRN

## 2022-11-07 MED ORDER — PERMETHRIN 5 % EX CREA: TOPICAL_CREAM | CUTANEOUS | 0 refills | Status: AC

## 2022-11-07 NOTE — ED Triage Notes (Signed)
Pt c/o scattered ?insect bites x 3 weeks-also c/o spider bite to left posterior thigh since 07/2022-pt also requesting STD testing/denies sx-NAD-steady gait

## 2022-11-07 NOTE — ED Provider Notes (Signed)
[facility]  Note:  This document was prepared using Conservation officer, historic buildings and may include unintentional dictation errors.  MRN: [id number] DOB: [birth year]  Subjective:   Amadis Fuhrer is a 22 y.o. female presenting for 3-week history of persistent itchy rash over.  No pain.  Patient was seen 10/13/2022, was prescribed topical antibiotic, topical steroid and oral antibiotic.  Was also prescribed fluconazole for secondary yeast infection.  Patient reports that the rash is currently worsens.  She would also like STI testing.  Denies fever, n/v, abdominal pain, pelvic pain, rashes, dysuria, urinary frequency, hematuria, vaginal discharge.    No current facility-administered medications for this encounter.  Current Outpatient Medications:    albuterol (VENTOLIN HFA) 108 (90 Base) MCG/ACT inhaler, Inhale 1-2 puffs into the lungs every 6 (six) hours as needed for wheezing or shortness of breath., Disp: 18 g, Rfl: 0   cetirizine (ZYRTEC ALLERGY) 10 MG tablet, Take 1 tablet (10 mg total) by mouth daily., Disp: 30 tablet, Rfl: 0   fluconazole (DIFLUCAN) 150 MG tablet, Take 1 tablet (150 mg total) by mouth daily. Take at first sign of vaginal yeast, Disp: 1 tablet, Rfl: 0   ibuprofen (ADVIL) 800 MG tablet, Take 1 tablet (800 mg total) by mouth 3 (three) times daily., Disp: 21 tablet, Rfl: 0   MULTIPLE VITAMINS PO, Take by mouth., Disp: , Rfl:    mupirocin ointment (BACTROBAN) 2 %, Apply 1 Application topically 2 (two) times daily. Apply to affected areas on legs, Disp: 22 g, Rfl: 0   norgestimate-ethinyl estradiol (ORTHO-CYCLEN) 0.25-35 MG-MCG tablet, Take 1 tablet by mouth daily., Disp: , Rfl:    predniSONE (DELTASONE) 20 MG tablet, Take 2 tablets daily with breakfast., Disp: 10 tablet, Rfl: 0   promethazine-dextromethorphan (PROMETHAZINE-DM) 6.25-15 MG/5ML syrup, Take 5 mLs by mouth 3 (three) times daily as needed for cough., Disp: 200 mL, Rfl: 0   triamcinolone  cream (KENALOG) 0.1 %, Apply 1 Application topically 2 (two) times daily. Apply to affected areas to arms., Disp: 30 g, Rfl: 0   No Known Allergies  Past Medical History:  Diagnosis Date   Allergy    Chlamydia      History reviewed. No pertinent surgical history.  Family History  Problem Relation Age of Onset   Diabetes Mother    Hyperlipidemia Father     Social History   Tobacco Use   Smoking status: Never   Smokeless tobacco: Never  Vaping Use   Vaping status: Never Used  Substance Use Topics   Alcohol use: Yes    Comment: occ   Drug use: Never    ROS   Objective:   Vitals: BP 110/72 (BP Location: Left Arm)   Pulse 92   Temp 98.9 F (37.2 C) (Oral)   Resp 16   LMP 10/27/2022   SpO2 97%   Physical Exam Constitutional:      General: She is not in acute distress.    Appearance: Normal appearance. She is well-developed. She is not ill-appearing, toxic-appearing or diaphoretic.  HENT:     Head: Normocephalic and atraumatic.     Nose: Nose normal.     Mouth/Throat:     Mouth: Mucous membranes are moist.  Eyes:     General: No scleral icterus.       Right eye: No discharge.        Left eye: No discharge.     Extraocular Movements: Extraocular movements intact.  Cardiovascular:  Rate and Rhythm: Normal rate.  Pulmonary:     Effort: Pulmonary effort is normal.  Skin:    General: Skin is warm and dry.     Findings: Rash (multiple scattered lesions, some with linear distribution, obvious burrows; symptoms are worst over the limbs but found in patches throughout the entire body sparing the face) present.  Neurological:     General: No focal deficit present.     Mental Status: She is alert and oriented to person, place, and time.  Psychiatric:        Mood and Affect: Mood normal.        Behavior: Behavior normal.     Assessment and Plan :   PDMP not reviewed this encounter.  1. Scabies   2. Screen for STD (sexually transmitted disease)    High  suspicion for scabies, recommended permethrin cream for 2 doses 1 week apart.  Use hydroxyzine for itching.  Hold all other treatments.  STI check pending.  Counseled patient on potential for adverse effects with medications prescribed/recommended today, ER and return-to-clinic precautions discussed, patient verbalized understanding.    Wallis Bamberg, [place] 11/07/22 1537

## 2022-11-08 LAB — HIV ANTIBODY (ROUTINE TESTING W REFLEX): HIV Screen 4th Generation wRfx: NONREACTIVE

## 2022-11-08 LAB — RPR: RPR Ser Ql: NONREACTIVE

## 2022-11-13 LAB — CERVICOVAGINAL ANCILLARY ONLY
Chlamydia: NEGATIVE
Comment: NEGATIVE
Comment: NEGATIVE
Comment: NORMAL
Neisseria Gonorrhea: NEGATIVE
Trichomonas: NEGATIVE

## 2022-11-14 ENCOUNTER — Ambulatory Visit
Admission: RE | Admit: 2022-11-14 | Discharge: 2022-11-14 | Disposition: A | Discharge status: Home / Self Care | Payer: 59 | Source: Ambulatory Visit | Attending: Urgent Care | Admitting: Urgent Care

## 2022-11-14 VITALS — BP 121/76 | HR 92 | Temp 99.9°F | Resp 17

## 2022-11-14 DIAGNOSIS — B86 Scabies: Secondary | ICD-10-CM | POA: Diagnosis not present

## 2022-11-14 MED ORDER — IVERMECTIN 3 MG PO TABS: 200.0000 ug/kg | ORAL_TABLET | Freq: Once | ORAL | 1 refills | Status: AC

## 2022-11-14 NOTE — Discharge Instructions (Signed)
We will try treatment with oral ivermectin. Take 5 tablets today in a single dose. Repeat 5 tablets one week from now. If your rash does not completely resolve, please follow up with dermatology.

## 2022-11-14 NOTE — ED Triage Notes (Signed)
Pt presents for a follow up from her prev visit. States she wants the pill to treat scabies.

## 2022-11-14 NOTE — ED Provider Notes (Signed)
[facility]    CSN: [id number] Arrival date & time: 11/14/22  1159      History   Chief Complaint Chief Complaint  Patient presents with   Follow-up    Entered by patient    HPI Geneveive Satkowiak is a 22 y.o. female.   22 year old female presents today due to concerns of a continued rash.  She was diagnosed with scabies week ago, used 1 dose of the topical permethrin.  She states she feels like it is improved some, with crusting in numerous areas.  She is concerned however as her symptoms persist.  She is specifically requesting to take the oral therapy.  Still some itching.  No new lesions.     Past Medical History:  Diagnosis Date   Allergy    Chlamydia     Patient Active Problem List   Diagnosis Date Noted   Allergic rhinitis 08/20/2014   RAD (reactive airway disease) 03/22/2012    History reviewed. No pertinent surgical history.  OB History   No obstetric history on file.      Home Medications    Prior to Admission medications   Medication Sig Start Date End Date Taking? Authorizing Provider  ivermectin (STROMECTOL) 3 MG TABS tablet Take 5 tablets (15,000 mcg total) by mouth once for 1 dose. Repeat second dose in 7 days. 11/14/22 11/14/22 Yes Levern Pitter L, PA  albuterol (VENTOLIN HFA) 108 (90 Base) MCG/ACT inhaler Inhale 1-2 puffs into the lungs every 6 (six) hours as needed for wheezing or shortness of breath. 04/25/22   Wallis Bamberg, PA-C  cetirizine (ZYRTEC ALLERGY) 10 MG tablet Take 1 tablet (10 mg total) by mouth daily. 04/25/22   Wallis Bamberg, PA-C  hydrOXYzine (ATARAX) 25 MG tablet Take 0.5-1 tablets (12.5-25 mg total) by mouth every 8 (eight) hours as needed for itching. 11/07/22   Wallis Bamberg, PA-C  ibuprofen (ADVIL) 800 MG tablet Take 1 tablet (800 mg total) by mouth 3 (three) times daily. 01/04/22   Carlisle Beers, FNP  MULTIPLE VITAMINS PO Take by mouth.    [provider]  norgestimate-ethinyl estradiol (ORTHO-CYCLEN) 0.25-35  MG-MCG tablet Take 1 tablet by mouth daily.    [provider]  permethrin (ELIMITE) 5 % cream Apply to all areas of the body from the neck to soles of feet. Leave on for 8 to 14 hours before removing by washing (shower or bath). 11/07/22   Wallis Bamberg, PA-C  fluticasone (FLONASE) 50 MCG/ACT nasal spray Place 2 sprays into both nostrils daily. Patient not taking: Reported on 05/02/2018 08/20/14 11/29/18  Elvina Sidle, MD    Family History Family History  Problem Relation Age of Onset   Diabetes Mother    Hyperlipidemia Father     Social History Social History   Tobacco Use   Smoking status: Never   Smokeless tobacco: Never  Vaping Use   Vaping status: Never Used  Substance Use Topics   Alcohol use: Yes    Comment: occ   Drug use: Never     Allergies   Patient has no known allergies.   Review of Systems Review of Systems As per HPI  Physical Exam Triage Vital Signs ED Triage Vitals  Encounter Vitals Group     BP 11/14/22 1234 121/76     Systolic BP Percentile --      Diastolic BP Percentile --      Pulse Rate 11/14/22 1234 92     Resp 11/14/22 1234 17  Temp 11/14/22 1234 99.9 F (37.7 C)     Temp Source 11/14/22 1234 Oral     SpO2 11/14/22 1234 96 %     Weight --      Height --      Head Circumference --      Peak Flow --      Pain Score 11/14/22 1233 0     Pain Loc --      Pain Education --      Exclude from Growth Chart --    No data found.  Updated Vital Signs BP 121/76 (BP Location: Left Arm)   Pulse 92   Temp 99.9 F (37.7 C) (Oral)   Resp 17   LMP 10/27/2022 (Exact Date)   SpO2 96%   Visual Acuity Right Eye Distance:   Left Eye Distance:   Bilateral Distance:    Right Eye Near:   Left Eye Near:    Bilateral Near:     Physical Exam Constitutional:      General: She is not in acute distress.    Appearance: Normal appearance. She is well-developed. She is not ill-appearing, toxic-appearing or diaphoretic.  HENT:      Head: Normocephalic and atraumatic.     Nose: Nose normal.     Mouth/Throat:     Mouth: Mucous membranes are moist.  Eyes:     General: No scleral icterus.       Right eye: No discharge.        Left eye: No discharge.     Extraocular Movements: Extraocular movements intact.  Cardiovascular:     Rate and Rhythm: Normal rate.  Pulmonary:     Effort: Pulmonary effort is normal.  Skin:    General: Skin is warm and dry.     Findings: Rash (multiple scattered lesions, some with linear distribution, obvious burrows; symptoms are worst over the limbs but found in patches throughout the entire body sparing the face) present.     Comments: Now with slight crusting  Neurological:     General: No focal deficit present.     Mental Status: She is alert and oriented to person, place, and time.  Psychiatric:        Mood and Affect: Mood normal.        Behavior: Behavior normal.      UC Treatments / Results  Labs (all labs ordered are listed, but only abnormal results are displayed) Labs Reviewed - No data to display  EKG   Radiology No results found.  Procedures Procedures (including critical care time)  Medications Ordered in UC Medications - No data to display  Initial Impression / Assessment and Plan / UC Course  I have reviewed the triage vital signs and the nursing notes.  Pertinent labs & imaging results that were available during my care of the patient were reviewed by me and considered in my medical decision making (see chart for details).     Scabies - pt states sx are mildly improving, but given her widespread rash, is requesting the oral medication. She is still having itching. Topical steroids made it worse, pt instructed to stop these. Recommended pt call dermatology and schedule a follow up visit should the rash persist to consider bx,.    Final Clinical Impressions(s) / UC Diagnoses   Final diagnoses:  Scabies     Discharge Instructions      We will try  treatment with oral ivermectin. Take 5 tablets today in a single dose. Repeat  5 tablets one week from now. If your rash does not completely resolve, please follow up with dermatology.    ED Prescriptions     Medication Sig Dispense Auth. Provider   ivermectin (STROMECTOL) 3 MG TABS tablet Take 5 tablets (15,000 mcg total) by mouth once for 1 dose. Repeat second dose in 7 days. 5 tablet Xsavier Seeley L, [place]      PDMP not reviewed this encounter.   Maretta Bees, [place] 11/14/22 1327
